# Patient Record
Sex: Male | Born: 1960 | Race: White | Hispanic: No | Marital: Married | Smoking: Never smoker
Health system: Southern US, Community
[De-identification: ages and names within clinical notes are randomized; demographics above are authoritative.]

## PROBLEM LIST (undated history)

## (undated) DIAGNOSIS — K219 Gastro-esophageal reflux disease without esophagitis: Secondary | ICD-10-CM

## (undated) DIAGNOSIS — J309 Allergic rhinitis, unspecified: Secondary | ICD-10-CM

## (undated) HISTORY — PX: WISDOM TOOTH EXTRACTION: SHX21

## (undated) HISTORY — DX: Gastro-esophageal reflux disease without esophagitis: K21.9

## (undated) HISTORY — PX: VASECTOMY: SHX75

## (undated) HISTORY — DX: Allergic rhinitis, unspecified: J30.9

---

## 2016-11-01 ENCOUNTER — Encounter: Payer: Self-pay | Admitting: *Deleted

## 2016-11-02 ENCOUNTER — Ambulatory Visit (INDEPENDENT_AMBULATORY_CARE_PROVIDER_SITE_OTHER): Payer: 59 | Admitting: Internal Medicine

## 2016-11-02 ENCOUNTER — Other Ambulatory Visit (INDEPENDENT_AMBULATORY_CARE_PROVIDER_SITE_OTHER): Payer: 59

## 2016-11-02 ENCOUNTER — Encounter: Payer: Self-pay | Admitting: Internal Medicine

## 2016-11-02 ENCOUNTER — Ambulatory Visit (INDEPENDENT_AMBULATORY_CARE_PROVIDER_SITE_OTHER)
Admission: RE | Admit: 2016-11-02 | Discharge: 2016-11-02 | Disposition: A | Discharge status: Home / Self Care | Payer: 59 | Source: Ambulatory Visit | Attending: Internal Medicine | Admitting: Internal Medicine

## 2016-11-02 VITALS — BP 124/84 | HR 87 | Ht 70.0 in | Wt 226.0 lb

## 2016-11-02 DIAGNOSIS — J45991 Cough variant asthma: Secondary | ICD-10-CM | POA: Diagnosis not present

## 2016-11-02 LAB — CBC WITH DIFFERENTIAL/PLATELET
BASOS ABS: 0 10*3/uL (ref 0.0–0.1)
BASOS PCT: 0.6 % (ref 0.0–3.0)
EOS ABS: 0 10*3/uL (ref 0.0–0.7)
Eosinophils Relative: 0.7 % (ref 0.0–5.0)
HEMATOCRIT: 47.8 % (ref 39.0–52.0)
Hemoglobin: 16.3 g/dL (ref 13.0–17.0)
LYMPHS ABS: 1.9 10*3/uL (ref 0.7–4.0)
LYMPHS PCT: 29.5 % (ref 12.0–46.0)
MCHC: 34 g/dL (ref 30.0–36.0)
MCV: 84.7 fl (ref 78.0–100.0)
MONO ABS: 0.5 10*3/uL (ref 0.1–1.0)
Monocytes Relative: 8.1 % (ref 3.0–12.0)
NEUTROS PCT: 61.1 % (ref 43.0–77.0)
Neutro Abs: 4 10*3/uL (ref 1.4–7.7)
PLATELETS: 226 10*3/uL (ref 150.0–400.0)
RBC: 5.65 Mil/uL (ref 4.22–5.81)
RDW: 13.7 % (ref 11.5–15.5)
WBC: 6.6 10*3/uL (ref 4.0–10.5)

## 2016-11-02 LAB — NITRIC OXIDE: Nitric Oxide: 14

## 2016-11-02 MED ORDER — FAMOTIDINE 20 MG PO TABS: ORAL_TABLET | ORAL | 2 refills | Status: AC

## 2016-11-02 NOTE — Patient Instructions (Addendum)
Change prilosec(omeprazole) to where you take it Take 30-60 min before first meal of the day and add pepcid 20 mg at bedtime (over the counter) x  3 month trial to see if cough better  If cough worsens stop clariton and For drainage / throat tickle try take CHLORPHENIRAMINE  4 mg - take one every 4 hours as needed - available over the counter- may cause drowsiness so start with just a bedtime dose or two and see how you tolerate it before trying in daytime    GERD (REFLUX)  is an extremely common cause of respiratory symptoms just like yours , many times with no obvious heartburn at all.    It can be treated with medication, but also with lifestyle changes including elevation of the head of your bed (ideally with 6 inch  bed blocks),  Smoking cessation, avoidance of late meals, excessive alcohol, and avoid fatty foods, chocolate, peppermint, colas, red wine, and acidic juices such as orange juice.  NO MINT OR MENTHOL PRODUCTS SO NO COUGH DROPS   USE SUGARLESS CANDY INSTEAD (Jolley ranchers or Stover's or Life Savers) or even ice chips will also do - the key is to swallow to prevent all throat clearing. NO OIL BASED VITAMINS - use powdered substitutes.  Please remember to go to the lab and x-ray department downstairs in the basement  for your tests - we will call you with the results when they are available.   If not satisfied return Please schedule a follow up visit in 3 months but call sooner if needed  with all medications /inhalers/ solutions in hand so we can verify exactly what you are taking. This includes all medications from all doctors and over the counters

## 2016-11-02 NOTE — Progress Notes (Signed)
**Note De-Identified Stuart Obfuscation** Subjective:     Patient ID: John Stuart, male   DOB: 08/20/1961,     MRN: [id number]  HPI  [id number] yowm never smoker coughed "all his life" referred to pulmonary clinic 11/02/2016 by Dr   Stuart    11/02/2016 [facility] office visit/ John Stuart   Chief Complaint  Patient presents with  . Pulmonary Consult    Referred by Dr. Caryn BeeKevin Stuart. Pt states that he has had a cough all of his life. He states that the cough does not bother him, but it bothers his spouse.   dry cough comes and goes, can be absent for months can happen a few days in a row  Then resolve completely  / worse in winter  Assoc with sense of pnds better on flonase / clariton x 3 weeks  Started omeprazole 3 weeks prior to OV     Kouffman Reflux v Neurogenic Cough Differentiator Reflux Comments  Do you awaken from a sound sleep coughing violently?                            With trouble breathing? No  no   Do you have choking episodes when you cannot  Get enough air, gasping for air ?              No    Do you usually cough when you lie down into  The bed, or when you just lie down to rest ?                          No    Do you usually cough after meals or eating?         no   Do you cough when (or after) you bend over?    no   GERD SCORE     Kouffman Reflux v Neurogenic Cough Differentiator Neurogenic   Do you more-or-less cough all day long? sporadic   Does change of temperature make you cough? yes   Does laughing or chuckling cause you to cough? No    Do fumes (perfume, automobile fumes, burned  Toast, etc.,) cause you to cough ?      No    Does speaking, singing, or talking on the phone cause you to cough   ?               No    Neurogenic/Airway score      Not limited by breathing from desired activities    No obvious day to day or daytime variability or assoc excess/ purulent sputum or mucus plugs or hemoptysis or cp or chest tightness, subjective wheeze or overt sinus or hb symptoms. No unusual exp hx or h/o  childhood pna/ asthma or knowledge of premature birth.  Sleeping ok without nocturnal  or early am exacerbation  of respiratory  c/o's or need for noct saba. Also denies any obvious fluctuation of symptoms with weather or environmental changes or other aggravating or alleviating factors except as outlined above   Current Medications, Allergies, Complete Past Medical History, Past Surgical History, Family History, and Social History were reviewed in [facility] electronic medical record.  ROS  The following are not active complaints unless bolded sore throat, dysphagia, dental problems, itching, sneezing,  nasal congestion or excess/ purulent secretions, ear ache,   fever, chills, sweats, unintended wt loss, classically pleuritic or exertional cp,  orthopnea pnd or leg  swelling, presyncope, palpitations, abdominal pain, anorexia, nausea, vomiting, diarrhea  or change in bowel or bladder habits, change in stools or urine, dysuria,hematuria,  rash, arthralgias, visual complaints, headache, numbness, weakness or ataxia or problems with walking or coordination,  change in mood/affect or memory.              Review of Systems     Objective:   Physical Exam amb wm nad   Wt Readings from Last 3 Encounters:  11/02/16 226 lb (102.5 kg)    Vital signs reviewed  - Note on arrival 02 sats  97% on RA    HEENT: nl dentition,   oropharynx. Nl external ear canals without cough reflex - moderate/ severe  bilateral non-specific turbinate edema     NECK :  without JVD/Nodes/TM/ nl carotid upstrokes bilaterally   LUNGS: no acc muscle use,  Nl contour chest which is clear to A and P bilaterally without cough on insp or exp maneuvers   CV:  RRR  no s3 or murmur or increase in P2, and no edema   ABD:  soft and nontender with nl inspiratory excursion in the supine position. No bruits or organomegaly appreciated, bowel sounds nl  MS:  Nl gait/ ext warm without deformities, calf tenderness,  cyanosis or clubbing No obvious joint restrictions   SKIN: warm and dry without lesions    NEURO:  alert, approp, nl sensorium with  no motor or cerebellar deficits apparent.      CXR PA and Lateral:   11/02/2016 :    I personally reviewed images and agree with radiology impression as follows:    No active cardiopulmonary disease.   Labs ordered  Allergy profile 11/02/2016 >  Eos 0.0/  IgE  Pending     Assessment:

## 2016-11-03 NOTE — Assessment & Plan Note (Signed)
Spirometry 11/02/2016  Nl - FENO 11/02/2016  =   14  - Allergy profile 11/02/2016 >  Eos 0.0 /  IgE    The standardized cough guidelines published in Chest by [place] in 2006 are still the best available and consist of a multiple step process (up to 12!) , not a single office visit,  and are intended  to address this problem logically,  with an alogrithm dependent on response to empiric treatment at  each progressive step  to determine a specific diagnosis with  minimal addtional testing needed. Therefore if adherence is an issue or can't be accurately verified,  it's very unlikely the standard evaluation and treatment will be successful here.    Furthermore, response to therapy (other than acute cough suppression, which should only be used short term with avoidance of narcotic containing cough syrups if possible), can be a gradual process for which the patient is not likely to  perceive immediate benefit.  Unlike going to an eye doctor where the best perscription is almost always the first one and is immediately effective, this is almost never the case in the management of chronic cough syndromes. Therefore the patient needs to commit up front to consistently adhere to recommendations  for up to 6 weeks of therapy directed at the likely underlying problem(s) before the response can be reasonably evaluated.   Pattern is not at all suggestive of asthma but rather Upper airway cough syndrome (previously labeled PNDS) , is  so named because it's frequently impossible to sort out how much is  CR/sinusitis with freq throat clearing (which can be related to primary GERD)   vs  causing  secondary (" extra esophageal")  GERD from wide swings in gastric pressure that occur with throat clearing, often  promoting self use of mint and menthol lozenges that reduce the lower esophageal sphincter tone and exacerbate the problem further in a cyclical fashion.   These are the same pts (now being labeled as having  "irritable larynx syndrome" by some cough centers) who not infrequently have a history of having failed to tolerate ace inhibitors,  dry powder inhalers or biphosphonates or report having atypical/extraesophageal reflux symptoms that don't respond to standard doses of PPI  and are easily confused as having aecopd or asthma flares by even experienced allergists/ pulmonologists (myself included).  Of the three most common causes of chronic cough, only one (GERD)  can actually trigger the other two (asthma and post nasal drip syndrome)  and perpetuate the cylce of cough inducing airway trauma, inflammation, heightened sensitivity to reflux which is prompted by the cough itself via a cyclical mechanism.    This may partially respond to steroids and look like asthma and post nasal drainage but never erradicated completely unless the cough and the secondary reflux are eliminated, preferably both at the same time.  While not intuitively obvious, many patients with chronic low grade reflux do not cough until there is a secondary insult that disturbs the protective epithelial barrier and exposes sensitive nerve endings.  This can be viral or direct physical injury such as with an endotracheal tube.   The point is that once this occurs, it is difficult to eliminate using anything but a maximally effective acid suppression regimen at least in the short run, accompanied by an appropriate diet to address non acid GERD.   rec max rx for gerd x 3 months then consider MCT if continues to cough on rx vs trial of gabaentin for irritable  larynx    Total time devoted to counseling  > 50 % of initial 60 min office visit:  review case with pt/ discussion of options/alternatives/ personally creating written customized instructions  in presence of pt  then going over those specific  Instructions directly with the pt including how to use all of the meds but in particular covering each new medication in detail and the difference  between the maintenance= "automatic" meds and the prns using an action plan format for the latter (If this problem/symptom => do that organization reading Left to right).  Please see AVS from this visit for a full list of these instructions which I personally wrote for this pt and  are unique to this visit.

## 2016-11-05 LAB — RESPIRATORY ALLERGY PROFILE REGION II ~~LOC~~
Allergen, A. alternata, m6: <0.1 kU/L
Allergen, C. Herbarum, M2: <0.1 kU/L
Allergen, Comm Silver Birch, t9: <0.1 kU/L
Allergen, Mulberry, t76: <0.1 kU/L
Allergen, P. notatum, m1: <0.1 kU/L
Aspergillus fumigatus, m3: <0.1 kU/L
Box Elder IgE: <0.1 kU/L
Cockroach: <0.1 kU/L
Common Ragweed: <0.1 kU/L
Elm IgE: <0.1 kU/L
IgE (Immunoglobulin E), Serum: 30 kU/L (ref <115)
Johnson Grass: <0.1 kU/L
Rough Pigweed  IgE: <0.1 kU/L
Sheep Sorrel IgE: <0.1 kU/L

## 2016-11-05 NOTE — Progress Notes (Signed)
LMTCB

## 2016-11-06 NOTE — Progress Notes (Signed)
LMTCB

## 2016-11-07 ENCOUNTER — Telehealth: Payer: Self-pay | Admitting: Internal Medicine

## 2016-11-07 NOTE — Telephone Encounter (Signed)
Notes Recorded by Nyoka CowdenMichael B Wert, MD on 11/05/2016 at 4:27 PM EST Call patient : Studies are unremarkable, no change in recs Notes Recorded by Nyoka CowdenMichael B Wert, MD on 11/03/2016 at 6:25 AM EST Call pt: Reviewed cxr and no acute change so no change in recommendations made at ov -------- lmtcb X1 to relay allergy profile and cxr results.

## 2016-11-08 NOTE — Telephone Encounter (Signed)
Patient is returning phone call.  °

## 2016-11-08 NOTE — Telephone Encounter (Signed)
lmtcb x1 for pt. 

## 2016-11-09 NOTE — Telephone Encounter (Signed)
lmomtcb x 2  

## 2016-11-12 NOTE — Telephone Encounter (Signed)
lmtcb x3 for pt. 

## 2016-11-13 NOTE — Telephone Encounter (Signed)
lmtcb x4 for pt. Per triage protocol, message will be closed. 

## 2017-02-08 ENCOUNTER — Ambulatory Visit: Payer: 59 | Admitting: Internal Medicine

## 2017-05-17 ENCOUNTER — Other Ambulatory Visit: Payer: Self-pay | Admitting: Family Medicine

## 2017-05-17 ENCOUNTER — Inpatient Hospital Stay (HOSPITAL_COMMUNITY)
Admission: EM | Admit: 2017-05-17 | Discharge: 2017-05-21 | DRG: 438 | Disposition: A | Discharge status: Home / Self Care | Payer: 59 | Attending: Internal Medicine | Admitting: Internal Medicine

## 2017-05-17 ENCOUNTER — Encounter (HOSPITAL_COMMUNITY): Payer: Self-pay

## 2017-05-17 ENCOUNTER — Emergency Department (HOSPITAL_COMMUNITY): Admit: 2017-05-17 | Discharge: 2017-05-17 | Disposition: A | Discharge status: Home / Self Care | Payer: 59

## 2017-05-17 ENCOUNTER — Observation Stay (HOSPITAL_COMMUNITY): Payer: 59

## 2017-05-17 ENCOUNTER — Ambulatory Visit
Admission: RE | Admit: 2017-05-17 | Discharge: 2017-05-17 | Disposition: A | Discharge status: Home / Self Care | Payer: 59 | Source: Ambulatory Visit | Attending: Family Medicine | Admitting: Family Medicine

## 2017-05-17 DIAGNOSIS — E785 Hyperlipidemia, unspecified: Secondary | ICD-10-CM | POA: Diagnosis present

## 2017-05-17 DIAGNOSIS — D72828 Other elevated white blood cell count: Secondary | ICD-10-CM

## 2017-05-17 DIAGNOSIS — K859 Acute pancreatitis without necrosis or infection, unspecified: Secondary | ICD-10-CM | POA: Diagnosis present

## 2017-05-17 DIAGNOSIS — R05 Cough: Secondary | ICD-10-CM

## 2017-05-17 DIAGNOSIS — I82409 Acute embolism and thrombosis of unspecified deep veins of unspecified lower extremity: Secondary | ICD-10-CM

## 2017-05-17 DIAGNOSIS — K8500 Idiopathic acute pancreatitis without necrosis or infection: Principal | ICD-10-CM | POA: Diagnosis present

## 2017-05-17 DIAGNOSIS — K219 Gastro-esophageal reflux disease without esophagitis: Secondary | ICD-10-CM | POA: Diagnosis present

## 2017-05-17 DIAGNOSIS — R059 Cough, unspecified: Secondary | ICD-10-CM

## 2017-05-17 DIAGNOSIS — K760 Fatty (change of) liver, not elsewhere classified: Secondary | ICD-10-CM | POA: Diagnosis present

## 2017-05-17 DIAGNOSIS — Z825 Family history of asthma and other chronic lower respiratory diseases: Secondary | ICD-10-CM

## 2017-05-17 DIAGNOSIS — R109 Unspecified abdominal pain: Secondary | ICD-10-CM

## 2017-05-17 DIAGNOSIS — J690 Pneumonitis due to inhalation of food and vomit: Secondary | ICD-10-CM | POA: Diagnosis not present

## 2017-05-17 DIAGNOSIS — J309 Allergic rhinitis, unspecified: Secondary | ICD-10-CM | POA: Diagnosis present

## 2017-05-17 DIAGNOSIS — Z79899 Other long term (current) drug therapy: Secondary | ICD-10-CM

## 2017-05-17 LAB — CBC
HEMATOCRIT: 36.1 % — AB (ref 39.0–52.0)
Hemoglobin: 12.3 g/dL — ABNORMAL LOW (ref 13.0–17.0)
MCH: 30.8 pg (ref 26.0–34.0)
MCHC: 34.1 g/dL (ref 30.0–36.0)
MCV: 90.5 fL (ref 78.0–100.0)
Platelets: 165 10*3/uL (ref 150–400)
RBC: 3.99 MIL/uL — ABNORMAL LOW (ref 4.22–5.81)
RDW: 12.9 % (ref 11.5–15.5)
WBC: 5.2 10*3/uL (ref 4.0–10.5)

## 2017-05-17 LAB — URINALYSIS, ROUTINE W REFLEX MICROSCOPIC
BACTERIA UA: NONE SEEN
Bilirubin Urine: NEGATIVE
Glucose, UA: NEGATIVE mg/dL
KETONES UR: 20 mg/dL — AB
Leukocytes, UA: NEGATIVE
Nitrite: NEGATIVE
PH: 5 (ref 5.0–8.0)
Protein, ur: NEGATIVE mg/dL

## 2017-05-17 LAB — COMPREHENSIVE METABOLIC PANEL
ALBUMIN: 4.2 g/dL (ref 3.5–5.0)
ALT: 27 U/L (ref 17–63)
AST: 28 U/L (ref 15–41)
Alkaline Phosphatase: 62 U/L (ref 38–126)
Anion gap: 10 (ref 5–15)
BUN: 20 mg/dL (ref 6–20)
CHLORIDE: 103 mmol/L (ref 101–111)
CO2: 22 mmol/L (ref 22–32)
Calcium: 8.9 mg/dL (ref 8.9–10.3)
Creatinine, Ser: 0.99 mg/dL (ref 0.61–1.24)
GFR calc Af Amer: >60 mL/min (ref >60)
GFR calc non Af Amer: >60 mL/min (ref >60)
GLUCOSE: 126 mg/dL — AB (ref 65–99)
POTASSIUM: 4 mmol/L (ref 3.5–5.1)
SODIUM: 135 mmol/L (ref 135–145)
Total Bilirubin: 1.1 mg/dL (ref 0.3–1.2)
Total Protein: 7.3 g/dL (ref 6.5–8.1)

## 2017-05-17 LAB — LIPASE, BLOOD: LIPASE: 256 U/L — AB (ref 11–51)

## 2017-05-17 MED ORDER — SODIUM CHLORIDE 0.9 % IV BOLUS (SEPSIS)
1000.0000 mL | Freq: Once | INTRAVENOUS | Status: AC
  Administered 2017-05-17: 1000 mL via INTRAVENOUS

## 2017-05-17 MED ORDER — IOPAMIDOL (ISOVUE-300) INJECTION 61%
125.0000 mL | Freq: Once | INTRAVENOUS | Status: AC | PRN
  Administered 2017-05-17: 125 mL via INTRAVENOUS

## 2017-05-17 MED ORDER — ENOXAPARIN SODIUM 40 MG/0.4ML ~~LOC~~ SOLN
40.0000 mg | Freq: Every day | SUBCUTANEOUS | Status: DC
  Administered 2017-05-19: 40 mg via SUBCUTANEOUS
  Filled 2017-05-17 (×2): qty 0.4

## 2017-05-17 MED ORDER — SODIUM CHLORIDE 0.9 % IV SOLN
INTRAVENOUS | Status: DC
  Administered 2017-05-17 – 2017-05-19 (×5): via INTRAVENOUS
  Administered 2017-05-19: 1000 mL via INTRAVENOUS
  Administered 2017-05-20: 01:00:00 via INTRAVENOUS

## 2017-05-17 MED ORDER — ACETAMINOPHEN 325 MG PO TABS
650.0000 mg | ORAL_TABLET | Freq: Four times a day (QID) | ORAL | Status: DC | PRN
  Administered 2017-05-18: 650 mg via ORAL
  Filled 2017-05-17: qty 2

## 2017-05-17 MED ORDER — MORPHINE SULFATE (PF) 2 MG/ML IV SOLN
2.0000 mg | INTRAVENOUS | Status: DC | PRN
  Administered 2017-05-17: 2 mg via INTRAVENOUS
  Administered 2017-05-18: 4 mg via INTRAVENOUS
  Administered 2017-05-18 (×2): 2 mg via INTRAVENOUS
  Filled 2017-05-17: qty 2
  Filled 2017-05-17 (×4): qty 1

## 2017-05-17 MED ORDER — ONDANSETRON HCL 4 MG/2ML IJ SOLN
4.0000 mg | Freq: Four times a day (QID) | INTRAMUSCULAR | Status: DC | PRN
  Administered 2017-05-18 – 2017-05-19 (×3): 4 mg via INTRAVENOUS
  Filled 2017-05-17 (×3): qty 2

## 2017-05-17 MED ORDER — ACETAMINOPHEN 650 MG RE SUPP: 650.0000 mg | Freq: Four times a day (QID) | RECTAL | Status: DC | PRN

## 2017-05-17 MED ORDER — ONDANSETRON HCL 4 MG PO TABS: 4.0000 mg | ORAL_TABLET | Freq: Four times a day (QID) | ORAL | Status: DC | PRN

## 2017-05-17 MED ORDER — MORPHINE SULFATE (PF) 4 MG/ML IV SOLN
4.0000 mg | Freq: Once | INTRAVENOUS | Status: AC
  Administered 2017-05-17: 4 mg via INTRAVENOUS
  Filled 2017-05-17: qty 1

## 2017-05-17 MED ORDER — ASPIRIN EC 81 MG PO TBEC
81.0000 mg | DELAYED_RELEASE_TABLET | Freq: Every day | ORAL | Status: DC
  Administered 2017-05-18 – 2017-05-21 (×4): 81 mg via ORAL
  Filled 2017-05-17 (×4): qty 1

## 2017-05-17 MED ORDER — ONDANSETRON HCL 4 MG/2ML IJ SOLN
4.0000 mg | Freq: Once | INTRAMUSCULAR | Status: AC
  Administered 2017-05-17: 4 mg via INTRAVENOUS
  Filled 2017-05-17: qty 2

## 2017-05-17 MED ORDER — PANTOPRAZOLE SODIUM 40 MG PO TBEC
40.0000 mg | DELAYED_RELEASE_TABLET | Freq: Every day | ORAL | Status: DC
  Administered 2017-05-18 – 2017-05-21 (×4): 40 mg via ORAL
  Filled 2017-05-17 (×4): qty 1

## 2017-05-17 MED ORDER — FLUTICASONE PROPIONATE 50 MCG/ACT NA SUSP
2.0000 | Freq: Every day | NASAL | Status: DC | PRN
  Filled 2017-05-17: qty 16

## 2017-05-17 MED ORDER — LORATADINE 10 MG PO TABS
10.0000 mg | ORAL_TABLET | Freq: Every day | ORAL | Status: DC
  Administered 2017-05-18 – 2017-05-21 (×4): 10 mg via ORAL
  Filled 2017-05-17 (×4): qty 1

## 2017-05-17 MED ORDER — ONDANSETRON HCL 4 MG/2ML IJ SOLN: 4.0000 mg | Freq: Four times a day (QID) | INTRAMUSCULAR | Status: DC | PRN

## 2017-05-17 MED ORDER — FAMOTIDINE 20 MG PO TABS
20.0000 mg | ORAL_TABLET | Freq: Every day | ORAL | Status: DC
Start: 2017-05-17 — End: 2017-05-21
  Administered 2017-05-18 – 2017-05-20 (×4): 20 mg via ORAL
  Filled 2017-05-17 (×4): qty 1

## 2017-05-17 NOTE — ED Notes (Signed)
Ultrasound in process at bedside at this time. 

## 2017-05-17 NOTE — Progress Notes (Signed)
*  PRELIMINARY RESULTS* Vascular Ultrasound Left lower extremity venous duplex has been completed.  Preliminary findings: No evidence of DVT or baker's cyst.   Farrel DemarkJill Eunice, RDMS, RVT  05/17/2017, 7:21 PM

## 2017-05-17 NOTE — ED Provider Notes (Signed)
[facility]-EMERGENCY DEPT Provider Note   CSN: [id number] Arrival date & time: 05/17/17  1632     History   Chief Complaint Chief Complaint  Patient presents with  . Abdominal Pain    HPI Semaj Coburn is a 56 y.o. male.  HPI   56 year old male with history of GERD presenting for evaluation of abdominal pain.patient states last night he felt fine this morning he felt fine however after around 9:00 in the morning when he was at work he developed mild abdominal discomfort.The discomfort became much more intense throughout the day, he felt nauseous without vomiting but he reported having crampy mid abdominal pain nothing seems to make it better or worse. He also have decrease in appetite. He went to his primary care office for evaluation. Blood work was performed and patient was told that he has an elevated white count. He had a abdominal and pelvis CT scan at an imaging center and subsequently was told that he has signs of pancreatitis as well as having a suspect DVT his left femoral region. He is recommended to come to the ER for admission. Earlier he did receive pain medication for his abdominal pain but now that the medication is one of he is report moderate abdominal pain. He denies any associated fever, chills, headache, lightheadedness, dizziness, chest pain, shortness of breath, dysuria, leg pain, focal numbness or weakness. He does report that his mom has leg DVT in the past. He has never had a history of blood clot in his legs or his lung. No recent travel, prolonged bed rest, unilateral leg swelling or calf pain. Denies any history of alcohol abuse and no history of diabetes. No recent travel, no new medication changes.  Past Medical History:  Diagnosis Date  . AR (allergic rhinitis)   . GERD (gastroesophageal reflux disease)     Patient Active Problem List   Diagnosis Date Noted  . 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  VASECTOMY    . WISDOM TOOTH EXTRACTION         Home Medications    Prior to Admission medications   Medication Sig Start Date End Date Taking? Authorizing Provider  ATORVASTATIN CALCIUM PO Take 1 tablet by mouth daily.    [provider]  famotidine (PEPCID) 20 MG tablet One at bedtime 11/02/16   Nyoka Cowden, MD  fluticasone [facility]) 50 MCG/ACT nasal spray Place 2 sprays into both nostrils daily as needed for allergies or rhinitis.    [provider]  loratadine (CLARITIN) 10 MG tablet Take 10 mg by mouth daily.    [provider]  omeprazole (PRILOSEC) 20 MG capsule Take 20 mg by mouth daily.    [provider]    Family History Family History  Problem Relation Age of Onset  . Clotting disorder Mother   . Asthma Mother   . Prostate cancer Father   . Asthma Daughter     Social History Social History  Substance Use Topics  . Smoking status: Never Smoker  . Smokeless tobacco: Never Used  . Alcohol use Yes     Comment: occasional     Allergies   Patient has no known allergies.   Review of Systems Review of Systems  All other systems reviewed and are negative.    Physical Exam Updated Vital Signs BP (!) 140/97 (BP Location: Left Arm)   Pulse 90   Temp 98.7 F (37.1 C) (Oral)  Resp 18   Wt 101.2 kg (223 lb)   SpO2 96%   BMI 32.00 kg/m   Physical Exam  Constitutional: He appears well-developed and well-nourished. No distress.  HENT:  Head: Atraumatic.  Eyes: Conjunctivae are normal.  Neck: Neck supple.  Cardiovascular: Normal rate and regular rhythm.   Pulmonary/Chest: Effort normal and breath sounds normal.  Abdominal: Soft. Bowel sounds are normal. He exhibits no distension. There is tenderness (tenderness to periumbilical region without guarding or rebound tenderness. Negative Murphy sign, no pain at McBurney's point).  Neurological: He is alert.  Skin: No rash noted.  Psychiatric: He has a normal mood and  affect.  Nursing note and vitals reviewed.    ED Treatments / Results  Labs (all labs ordered are listed, but only abnormal results are displayed) Labs Reviewed  LIPASE, BLOOD - Abnormal; Notable for the following:       Result Value   Lipase 256 (*)    All other components within normal limits  COMPREHENSIVE METABOLIC PANEL - Abnormal; Notable for the following:    Glucose, Bld 126 (*)    All other components within normal limits  CBC - Abnormal; Notable for the following:    RBC 3.99 (*)    Hemoglobin 12.3 (*)    HCT 36.1 (*)    All other components within normal limits  URINALYSIS, ROUTINE W REFLEX MICROSCOPIC - Abnormal; Notable for the following:    Specific Gravity, Urine >1.046 (*)    Hgb urine dipstick SMALL (*)    Ketones, ur 20 (*)    Squamous Epithelial / LPF 0-5 (*)    All other components within normal limits    EKG  EKG Interpretation None       Radiology Ct Abdomen Pelvis W Contrast  Result Date: 05/17/2017 CLINICAL DATA:  Acute abdominal pain, elevated white count EXAM: CT ABDOMEN AND PELVIS WITH CONTRAST TECHNIQUE: Multidetector CT imaging of the abdomen and pelvis was performed using the standard protocol following bolus administration of intravenous contrast. CONTRAST:  ISOVUE-300 IOPAMIDOL (ISOVUE-300) INJECTION 61% COMPARISON:  None. FINDINGS: Lower chest: Minor bibasilar atelectasis. No lower lobe pneumonia or acute process. No pericardial or pleural effusion. Normal heart size. Symmetric gynecomastia. Hepatobiliary: No focal liver abnormality is seen. No gallstones, gallbladder wall thickening, or biliary dilatation. Pancreas: Mild strandy edema and inflammation about the pancreas body and tail compatible with mild acute pancreatitis. No surrounding fluid collection or abscess. No other complicating feature. Spleen: Normal in size without focal abnormality. Adrenals/Urinary Tract: Adrenal glands are unremarkable. Kidneys are normal, without renal  calculi, focal lesion, or hydronephrosis. Bladder is unremarkable. Stomach/Bowel: Negative for bowel obstruction, significant dilatation, ileus, or free air. Normal appendix demonstrated. No acute colonic abnormality. No fluid collection or abscess. Vascular/Lymphatic: Negative for aneurysm or significant atherosclerosis. Mesenteric and renal vasculature all remain patent. No retroperitoneal abnormality or hemorrhage. No adenopathy. Hepatic and portal veins are patent. Splenic and mesenteric veins are patent. Nonocclusive hypodense filling defect in the left common femoral vein and superficial femoral vein suspicious for left femoral DVT. Reproductive: No acute finding by CT. Prostate normal size. Remote vasectomy clips noted. Other: Small fat containing inguinal hernias and small fat containing umbilical hernia. No fluid collection or ascites. Musculoskeletal: No acute osseous finding IMPRESSION: Mild acute pancreatitis of the pancreas body and tail in the left upper quadrant. No other complicating feature. Nonocclusive left common femoral and superficial femoral DVT is suspected by CT. Recommend follow-up ultrasound Electronically Signed   By: Judie Petit.  Shick M.D.   On: 05/17/2017 15:34    Procedures Procedures (including critical care time)  Medications Ordered in ED Medications  0.9 %  sodium chloride infusion (not administered)  morphine 2 MG/ML injection 2-4 mg (not administered)  ondansetron (ZOFRAN) injection 4 mg (not administered)  aspirin EC tablet 81 mg (not administered)  pantoprazole (PROTONIX) EC tablet 40 mg (not administered)  loratadine (CLARITIN) tablet 10 mg (not administered)  sodium chloride 0.9 % bolus 1,000 mL (1,000 mLs Intravenous New Bag/Given 05/17/17 1911)  morphine 4 MG/ML injection 4 mg (4 mg Intravenous Given 05/17/17 1911)  ondansetron (ZOFRAN) injection 4 mg (4 mg Intravenous Given 05/17/17 1911)     Initial Impression / Assessment and Plan / ED Course  I have reviewed the  triage vital signs and the nursing notes.  Pertinent labs & imaging results that were available during my care of the patient were reviewed by me and considered in my medical decision making (see chart for details).     BP (!) 140/97 (BP Location: Left Arm)   Pulse 90   Temp 98.7 F (37.1 C) (Oral)   Resp 18   Wt 101.2 kg (223 lb)   SpO2 96%   BMI 32.00 kg/m    Final Clinical Impressions(s) / ED Diagnoses   Final diagnoses:  Idiopathic acute pancreatitis without infection or necrosis    New Prescriptions New Prescriptions   No medications on file   6:19 PM Patient presents with midabdominal pain. An abdominal and pelvic CT scan performed earlier today demonstrated evidence of pancreatitis. He also has an elevated lipase in the 200s. Will give IVF, pain medication and antinausea medication.  Pt made NPO.  Furthermore, CT also noted a non occlusive L femoral DVT.  PT has no sxs in that leg.  Will obtain venous doppler US.    8:19 PM Negative doppler US.  Appreciate consultation from [facility] Dr. Julian ReilGardner who agrees to see pt in the ER and admit for pain control and IV hydration, observation status.     Fayrene Helperran, Tamerra Merkley, PA-C 05/17/17 2020    Tegeler, Canary Brimhristopher J, MD 05/18/17 [phone]

## 2017-05-17 NOTE — ED Triage Notes (Signed)
Patient c/o generalized abdominal pain. Patient states he went to a walk-in clinic. Patient states he had a CT scan today. Patient was told he had pancreatitis and a possible DVT in the femoral artery.

## 2017-05-17 NOTE — ED Notes (Signed)
Bed: WA12 Expected date:  Expected time:  Means of arrival:  Comments: No bed 

## 2017-05-17 NOTE — H&P (Signed)
History and Physical    John Stuart UJW:[id number] DOB: 1961/02/10 DOA: 05/17/2017  PCP: Iva Boop, MD  Patient coming from: Home  I have personally briefly reviewed patient's old medical records in [facility]  Chief Complaint: Abd pain  HPI: John Stuart is a 56 y.o. male with medical history significant of GERD, HLD on atorvastatin.  Patient presents to the ED with c/o abd pain.  Pain onset this AM around 9am, mild, worsened throughout day.  Nausea but no vomiting.  No diarrhea.  Nothing makes better or worse.  No recent EtOH use. No scorpion stings or recent travel.  ED Course: Mild acute pancreatitis on CT, lipase 250.  [place] leg is negative for DVT.   Review of Systems: As per HPI otherwise 10 point review of systems negative.   Past Medical History:  Diagnosis Date  . AR (allergic rhinitis)   . GERD (gastroesophageal reflux disease)     Past Surgical History:  Procedure Laterality Date  . VASECTOMY    . WISDOM TOOTH EXTRACTION       reports that he has never smoked. He has never used smokeless tobacco. He reports that he drinks alcohol. He reports that he does not use drugs.  No Known Allergies  Family History  Problem Relation Age of Onset  . Clotting disorder Mother   . Asthma Mother   . Prostate cancer Father   . Asthma Daughter      Prior to Admission medications   Medication Sig Start Date End Date Taking? Authorizing Provider  aspirin EC 81 MG tablet Take 81 mg by mouth daily.   Yes [provider]  ATORVASTATIN CALCIUM PO Take 1 tablet by mouth daily.   Yes [provider]  Coenzyme Q10 (CO Q 10 PO) Take 1 capsule by mouth at bedtime.   Yes [provider]  famotidine (PEPCID) 20 MG tablet One at bedtime 11/02/16  Yes Nyoka Cowden, MD  fluticasone [facility]) 50 MCG/ACT nasal spray Place 2 sprays into both nostrils daily as needed for allergies or rhinitis.   Yes [provider]  loratadine (CLARITIN) 10 MG  tablet Take 10 mg by mouth daily.   Yes [provider]  omeprazole (PRILOSEC) 20 MG capsule Take 20 mg by mouth daily.   Yes [provider]    Physical Exam: Vitals:   05/17/17 1646 05/17/17 2024  BP: (!) 140/97 123/83  Pulse: 90 87  Resp: 18 18  Temp: 98.7 F (37.1 C)   TempSrc: Oral   SpO2: 96% 92%  Weight: 101.2 kg (223 lb)     Constitutional: NAD, calm, comfortable Eyes: PERRL, lids and conjunctivae normal ENMT: Mucous membranes are moist. Posterior pharynx clear of any exudate or lesions.Normal dentition.  Neck: normal, supple, no masses, no thyromegaly Respiratory: clear to auscultation bilaterally, no wheezing, no crackles. Normal respiratory effort. No accessory muscle use.  Cardiovascular: Regular rate and rhythm, no murmurs / rubs / gallops. No extremity edema. 2+ pedal pulses. No carotid bruits.  Abdomen: Epigastric tenderness Musculoskeletal: no clubbing / cyanosis. No joint deformity upper and lower extremities. Good ROM, no contractures. Normal muscle tone.  Skin: no rashes, lesions, ulcers. No induration Neurologic: CN 2-12 grossly intact. Sensation intact, DTR normal. Strength 5/5 in all 4.  Psychiatric: Normal judgment and insight. Alert and oriented x 3. Normal mood.    Labs on Admission: I have personally reviewed following labs and imaging studies  CBC:  Recent Labs Lab 05/17/17 1723  WBC  5.2  HGB 12.3*  HCT 36.1*  MCV 90.5  PLT 165   Basic Metabolic Panel:  Recent Labs Lab 05/17/17 1723  NA 135  K 4.0  CL 103  CO2 22  GLUCOSE 126*  BUN 20  CREATININE 0.99  CALCIUM 8.9   GFR: CrCl cannot be calculated (Unknown ideal weight.). Liver Function Tests:  Recent Labs Lab 05/17/17 1723  AST 28  ALT 27  ALKPHOS 62  BILITOT 1.1  PROT 7.3  ALBUMIN 4.2    Recent Labs Lab 05/17/17 1723  LIPASE 256*   No results for input(s): AMMONIA in the last 168 hours. Coagulation Profile: No results for input(s): INR,  PROTIME in the last 168 hours. Cardiac Enzymes: No results for input(s): CKTOTAL, CKMB, CKMBINDEX, TROPONINI in the last 168 hours. BNP (last 3 results) No results for input(s): PROBNP in the last 8760 hours. HbA1C: No results for input(s): HGBA1C in the last 72 hours. CBG: No results for input(s): GLUCAP in the last 168 hours. Lipid Profile: No results for input(s): CHOL, HDL, LDLCALC, TRIG, CHOLHDL, LDLDIRECT in the last 72 hours. Thyroid Function Tests: No results for input(s): TSH, T4TOTAL, FREET4, T3FREE, THYROIDAB in the last 72 hours. Anemia Panel: No results for input(s): VITAMINB12, FOLATE, FERRITIN, TIBC, IRON, RETICCTPCT in the last 72 hours. Urine analysis:    Component Value Date/Time   COLORURINE YELLOW 05/17/2017 1659   APPEARANCEUR CLEAR 05/17/2017 1659   LABSPEC >1.046 (H) 05/17/2017 1659   PHURINE 5.0 05/17/2017 1659   GLUCOSEU NEGATIVE 05/17/2017 1659   HGBUR SMALL (A) 05/17/2017 1659   BILIRUBINUR NEGATIVE 05/17/2017 1659   KETONESUR 20 (A) 05/17/2017 1659   PROTEINUR NEGATIVE 05/17/2017 1659   NITRITE NEGATIVE 05/17/2017 1659   LEUKOCYTESUR NEGATIVE 05/17/2017 1659    Radiological Exams on Admission: Ct Abdomen Pelvis W Contrast  Result Date: 05/17/2017 CLINICAL DATA:  Acute abdominal pain, elevated white count EXAM: CT ABDOMEN AND PELVIS WITH CONTRAST TECHNIQUE: Multidetector CT imaging of the abdomen and pelvis was performed using the standard protocol following bolus administration of intravenous contrast. CONTRAST:  125mL ISOVUE-300 IOPAMIDOL (ISOVUE-300) INJECTION 61% COMPARISON:  None. FINDINGS: Lower chest: Minor bibasilar atelectasis. No lower lobe pneumonia or acute process. No pericardial or pleural effusion. Normal heart size. Symmetric gynecomastia. Hepatobiliary: No focal liver abnormality is seen. No gallstones, gallbladder wall thickening, or biliary dilatation. Pancreas: Mild strandy edema and inflammation about the pancreas body and tail  compatible with mild acute pancreatitis. No surrounding fluid collection or abscess. No other complicating feature. Spleen: Normal in size without focal abnormality. Adrenals/Urinary Tract: Adrenal glands are unremarkable. Kidneys are normal, without renal calculi, focal lesion, or hydronephrosis. Bladder is unremarkable. Stomach/Bowel: Negative for bowel obstruction, significant dilatation, ileus, or free air. Normal appendix demonstrated. No acute colonic abnormality. No fluid collection or abscess. Vascular/Lymphatic: Negative for aneurysm or significant atherosclerosis. Mesenteric and renal vasculature all remain patent. No retroperitoneal abnormality or hemorrhage. No adenopathy. Hepatic and portal veins are patent. Splenic and mesenteric veins are patent. Nonocclusive hypodense filling defect in the left common femoral vein and superficial femoral vein suspicious for left femoral DVT. Reproductive: No acute finding by CT. Prostate normal size. Remote vasectomy clips noted. Other: Small fat containing inguinal hernias and small fat containing umbilical hernia. No fluid collection or ascites. Musculoskeletal: No acute osseous finding IMPRESSION: Mild acute pancreatitis of the pancreas body and tail in the left upper quadrant. No other complicating feature. Nonocclusive left common femoral and superficial femoral DVT is suspected by CT. Recommend follow-up  ultrasound Electronically Signed   By: Judie Petit.  Shick M.D.   On: 05/17/2017 15:34    EKG: Independently reviewed.  Assessment/Plan Principal Problem:   Idiopathic acute pancreatitis without infection or necrosis    1. Acute pancreatitis - Mild and idiopathic at this point, DDx includes: gallstone, atorvastatin induced, autoimmune, triglyceride induced 1. RUQ [place] although CT didn't demonstrate findings suggestive of gallbladder disease nor did CMP 2. Repeat CMP in AM 3. IVF: 1L bolus in ED and NS at 150 cc/hr 4. Morphine prn 5. zofran prn 6. Lipid  profile  DVT prophylaxis: Lovenox Code Status: Full Family Communication: Wife at bedside Disposition Plan: Home after admit Consults called: None Admission status: [facility] in [facility], [place]. DO [facility] Pager [phone]  If 7AM-7PM, please contact day team taking care of patient [email or web address] Password [facility]  05/17/2017, 8:38 PM

## 2017-05-18 DIAGNOSIS — K219 Gastro-esophageal reflux disease without esophagitis: Secondary | ICD-10-CM | POA: Diagnosis present

## 2017-05-18 DIAGNOSIS — Z79899 Other long term (current) drug therapy: Secondary | ICD-10-CM | POA: Diagnosis not present

## 2017-05-18 DIAGNOSIS — E785 Hyperlipidemia, unspecified: Secondary | ICD-10-CM | POA: Diagnosis present

## 2017-05-18 DIAGNOSIS — J309 Allergic rhinitis, unspecified: Secondary | ICD-10-CM | POA: Diagnosis present

## 2017-05-18 DIAGNOSIS — K760 Fatty (change of) liver, not elsewhere classified: Secondary | ICD-10-CM | POA: Diagnosis present

## 2017-05-18 DIAGNOSIS — Z825 Family history of asthma and other chronic lower respiratory diseases: Secondary | ICD-10-CM | POA: Diagnosis not present

## 2017-05-18 DIAGNOSIS — J690 Pneumonitis due to inhalation of food and vomit: Secondary | ICD-10-CM | POA: Diagnosis not present

## 2017-05-18 DIAGNOSIS — K8500 Idiopathic acute pancreatitis without necrosis or infection: Secondary | ICD-10-CM | POA: Diagnosis present

## 2017-05-18 DIAGNOSIS — K859 Acute pancreatitis without necrosis or infection, unspecified: Secondary | ICD-10-CM | POA: Diagnosis present

## 2017-05-18 LAB — COMPREHENSIVE METABOLIC PANEL
ALT: 22 U/L (ref 17–63)
AST: 23 U/L (ref 15–41)
Albumin: 3.3 g/dL — ABNORMAL LOW (ref 3.5–5.0)
Alkaline Phosphatase: 46 U/L (ref 38–126)
Anion gap: 8 (ref 5–15)
BUN: 19 mg/dL (ref 6–20)
CHLORIDE: 104 mmol/L (ref 101–111)
CO2: 22 mmol/L (ref 22–32)
CREATININE: 0.97 mg/dL (ref 0.61–1.24)
Calcium: 8 mg/dL — ABNORMAL LOW (ref 8.9–10.3)
GFR calc Af Amer: >60 mL/min (ref >60)
GFR calc non Af Amer: >60 mL/min (ref >60)
Glucose, Bld: 126 mg/dL — ABNORMAL HIGH (ref 65–99)
Potassium: 3.6 mmol/L (ref 3.5–5.1)
SODIUM: 134 mmol/L — AB (ref 135–145)
Total Bilirubin: 1.2 mg/dL (ref 0.3–1.2)
Total Protein: 5.9 g/dL — ABNORMAL LOW (ref 6.5–8.1)

## 2017-05-18 LAB — CBC
HCT: 42.8 % (ref 39.0–52.0)
HEMOGLOBIN: 14.8 g/dL (ref 13.0–17.0)
MCH: 29.1 pg (ref 26.0–34.0)
MCHC: 34.6 g/dL (ref 30.0–36.0)
MCV: 84.1 fL (ref 78.0–100.0)
PLATELETS: 194 10*3/uL (ref 150–400)
RBC: 5.09 MIL/uL (ref 4.22–5.81)
RDW: 13.2 % (ref 11.5–15.5)
WBC: 21.3 10*3/uL — ABNORMAL HIGH (ref 4.0–10.5)

## 2017-05-18 LAB — LIPID PANEL
Cholesterol: 138 mg/dL (ref 0–200)
HDL: 34 mg/dL — ABNORMAL LOW (ref >40)
LDL CALC: 97 mg/dL (ref 0–99)
TRIGLYCERIDES: 37 mg/dL (ref <150)
Total CHOL/HDL Ratio: 4.1 RATIO
VLDL: 7 mg/dL (ref 0–40)

## 2017-05-18 LAB — LIPASE, BLOOD: Lipase: 201 U/L — ABNORMAL HIGH (ref 11–51)

## 2017-05-18 LAB — HIV ANTIBODY (ROUTINE TESTING W REFLEX): HIV SCREEN 4TH GENERATION: NONREACTIVE

## 2017-05-18 MED ORDER — ACETAMINOPHEN 650 MG RE SUPP: 650.0000 mg | Freq: Four times a day (QID) | RECTAL | Status: DC | PRN

## 2017-05-18 MED ORDER — MORPHINE SULFATE (PF) 2 MG/ML IV SOLN
2.0000 mg | INTRAVENOUS | Status: DC | PRN
  Administered 2017-05-18 (×2): 4 mg via INTRAVENOUS
  Administered 2017-05-18: 2 mg via INTRAVENOUS
  Filled 2017-05-18 (×3): qty 2

## 2017-05-18 MED ORDER — ACETAMINOPHEN 325 MG PO TABS
650.0000 mg | ORAL_TABLET | Freq: Four times a day (QID) | ORAL | Status: DC | PRN
  Administered 2017-05-18 – 2017-05-19 (×4): 650 mg via ORAL
  Filled 2017-05-18 (×4): qty 2

## 2017-05-18 NOTE — Progress Notes (Signed)
[facility] PROGRESS NOTE  John Stuart JXB:[id number] DOB: 1960-12-16 DOA: 05/17/2017  PCP: Iva Boop, MD  Brief History/Interval Summary: 56 year old Caucasian male with a past medical history of GERD, hyperlipidemia, presented with complaints of abdominal pain. He was found to have acute pancreatitis and was hospitalized for further management.  Reason for Visit: Acute pancreatitis  Consultants: None  Procedures:  Left lower extremity venous Doppler negative for DVT  Antibiotics: None  Subjective/Interval History: Patient states that he feels better compared to yesterday. Continues to have 4-5 out of 10 pain in the upper abdomen with some radiation to the back. No nausea, vomiting. No bowel movements. Not passing gas yet.  ROS: Denies any chest pain or shortness of breath.  Objective:  Vital Signs  Vitals:   05/17/17 2100 05/17/17 2200 05/18/17 0434 05/18/17 0520  BP:  135/86 121/68   Pulse:  96 98   Resp:  18 18   Temp:  98.9 F (37.2 C) (!) 100.8 F (38.2 C) 99.9 F (37.7 C)  TempSrc:  Oral Oral Oral  SpO2:  93% 92%   Weight:  101.2 kg (223 lb)    Height: 5' 8.11" (1.73 m)  (1.778 m)      Intake/Output Summary (Last 24 hours) at 05/18/17 1106 Last data filed at 05/18/17 0300  Gross per 24 hour  Intake             1735 ml  Output                0 ml  Net             1735 ml   Filed Weights   05/17/17 1646 05/17/17 2200  Weight: 101.2 kg (223 lb) 101.2 kg (223 lb)    General appearance: alert, cooperative, appears stated age and no distress Head: Normocephalic, without obvious abnormality, atraumatic Resp: clear to auscultation bilaterally Cardio: regular rate and rhythm, S1, S2 normal, no murmur, click, rub or gallop GI: Abdomen is soft. Tenderness appreciated in the epigastric area without any rebound, rigidity or guarding. No masses or organomegaly. Bowel sounds are present. Extremities: extremities normal, atraumatic, no cyanosis or  edema Neurologic: Awake, alert. Oriented 3. No focal neurological deficits.  Lab Results:  Data Reviewed: I have personally reviewed following labs and imaging studies  CBC:  Recent Labs Lab 05/17/17 1723 05/18/17 0425  WBC 5.2 21.3*  HGB 12.3* 14.8  HCT 36.1* 42.8  MCV 90.5 84.1  PLT 165 194    Basic Metabolic Panel:  Recent Labs Lab 05/17/17 1723 05/18/17 0425  NA 135 134*  K 4.0 3.6  CL 103 104  CO2 22 22  GLUCOSE 126* 126*  BUN 20 19  CREATININE 0.99 0.97  CALCIUM 8.9 8.0*    GFR: Estimated Creatinine Clearance: 100.2 mL/min (by C-G formula based on SCr of 0.97 mg/dL).  Liver Function Tests:  Recent Labs Lab 05/17/17 1723 05/18/17 0425  AST 28 23  ALT 27 22  ALKPHOS 62 46  BILITOT 1.1 1.2  PROT 7.3 5.9*  ALBUMIN 4.2 3.3*     Recent Labs Lab 05/17/17 1723 05/18/17 0425  LIPASE 256* 201*    Lipid Profile:  Recent Labs  05/17/17 2028  CHOL 138  HDL 34*  LDLCALC 97  TRIG 37  CHOLHDL 4.1    Radiology Studies: Ct Abdomen Pelvis W Contrast  Result Date: 05/17/2017 CLINICAL DATA:  Acute abdominal pain, elevated white count EXAM: CT ABDOMEN AND PELVIS WITH CONTRAST TECHNIQUE: Multidetector  CT imaging of the abdomen and pelvis was performed using the standard protocol following bolus administration of intravenous contrast. CONTRAST:  125mL ISOVUE-300 IOPAMIDOL (ISOVUE-300) INJECTION 61% COMPARISON:  None. FINDINGS: Lower chest: Minor bibasilar atelectasis. No lower lobe pneumonia or acute process. No pericardial or pleural effusion. Normal heart size. Symmetric gynecomastia. Hepatobiliary: No focal liver abnormality is seen. No gallstones, gallbladder wall thickening, or biliary dilatation. Pancreas: Mild strandy edema and inflammation about the pancreas body and tail compatible with mild acute pancreatitis. No surrounding fluid collection or abscess. No other complicating feature. Spleen: Normal in size without focal abnormality. Adrenals/Urinary  Tract: Adrenal glands are unremarkable. Kidneys are normal, without renal calculi, focal lesion, or hydronephrosis. Bladder is unremarkable. Stomach/Bowel: Negative for bowel obstruction, significant dilatation, ileus, or free air. Normal appendix demonstrated. No acute colonic abnormality. No fluid collection or abscess. Vascular/Lymphatic: Negative for aneurysm or significant atherosclerosis. Mesenteric and renal vasculature all remain patent. No retroperitoneal abnormality or hemorrhage. No adenopathy. Hepatic and portal veins are patent. Splenic and mesenteric veins are patent. Nonocclusive hypodense filling defect in the left common femoral vein and superficial femoral vein suspicious for left femoral DVT. Reproductive: No acute finding by CT. Prostate normal size. Remote vasectomy clips noted. Other: Small fat containing inguinal hernias and small fat containing umbilical hernia. No fluid collection or ascites. Musculoskeletal: No acute osseous finding IMPRESSION: Mild acute pancreatitis of the pancreas body and tail in the left upper quadrant. No other complicating feature. Nonocclusive left common femoral and superficial femoral DVT is suspected by CT. Recommend follow-up ultrasound Electronically Signed   By: Judie PetitM.  Shick M.D.   On: 05/17/2017 15:34   [place] Abdomen Limited Ruq  Result Date: 05/17/2017 CLINICAL DATA:  Patient with history of pancreatitis. EXAM: ULTRASOUND ABDOMEN LIMITED RIGHT UPPER QUADRANT COMPARISON:  CT abdomen pelvis 05/17/2017 FINDINGS: Gallbladder: No gallbladder wall thickening. Suggestion of 2 adjacent polyps measuring up to 4 mm. No pericholecystic fluid. Negative sonographic Murphy sign. Common bile duct: Diameter: 5 mm Liver: Increased in echogenicity. No focal lesion identified. Portal vein is patent on color Doppler imaging with normal direction of blood flow towards the liver. IMPRESSION: No cholelithiasis or sonographic evidence for acute cholecystitis. Hepatic steatosis.  Electronically Signed   By: Annia Beltrew  Davis M.D.   On: 05/17/2017 21:47     Medications:  Scheduled: . aspirin EC  81 mg Oral Daily  . enoxaparin (LOVENOX) injection  40 mg Subcutaneous QHS  . famotidine  20 mg Oral QHS  . loratadine  10 mg Oral Daily  . pantoprazole  40 mg Oral Daily   Continuous: . sodium chloride 125 mL/hr at 05/18/17 0947   ZOX:[id number]:acetaminophen **OR** acetaminophen, fluticasone, morphine injection, ondansetron **OR** ondansetron (ZOFRAN) IV  Assessment/Plan:  Principal Problem:   Idiopathic acute pancreatitis without infection or necrosis    Acute pancreatitis. Patient denies significant alcohol intake. No gallstones identified on ultrasound. Reason for his pancreatitis is not entirely clear. Triglyceride level is normal. He has been taking atorvastatin for a year. This is likely idiopathic. For now continue supportive care. Continue IV fluids. Leave him on clear liquid diet for now. Increase in WBC is noted. Low-grade fever is present. No findings of necrosis noted on CT scan. Hold off on antibiotics.  Left lower extremity DVT suspected on CT scan. Doppler studies negative for DVT. No further workup.  History of GERD. Continue PPI.   DVT Prophylaxis: Lovenox    Code Status: Full code  Family Communication: Discussed with the patient  Disposition Plan: Management  as outlined above    LOS: 0 days   [facility]  [facility] Pager [phone] 05/18/2017, 11:06 AM  If 7PM-7AM, please contact night-coverage at [email or web address], password [facility]

## 2017-05-18 NOTE — Progress Notes (Signed)
Dr, Rito EhrlichKrishnan notified of temp of 100.9. He requested to go ahead  And adm tylenol

## 2017-05-19 ENCOUNTER — Inpatient Hospital Stay (HOSPITAL_COMMUNITY): Payer: 59

## 2017-05-19 LAB — COMPREHENSIVE METABOLIC PANEL
ALBUMIN: 3 g/dL — AB (ref 3.5–5.0)
ALK PHOS: 40 U/L (ref 38–126)
ALT: 28 U/L (ref 17–63)
ANION GAP: 7 (ref 5–15)
AST: 31 U/L (ref 15–41)
BILIRUBIN TOTAL: 1.7 mg/dL — AB (ref 0.3–1.2)
BUN: 13 mg/dL (ref 6–20)
CALCIUM: 7.8 mg/dL — AB (ref 8.9–10.3)
CO2: 20 mmol/L — AB (ref 22–32)
Chloride: 104 mmol/L (ref 101–111)
Creatinine, Ser: 0.9 mg/dL (ref 0.61–1.24)
GFR calc Af Amer: >60 mL/min (ref >60)
GFR calc non Af Amer: >60 mL/min (ref >60)
GLUCOSE: 135 mg/dL — AB (ref 65–99)
Potassium: 3.8 mmol/L (ref 3.5–5.1)
SODIUM: 131 mmol/L — AB (ref 135–145)
TOTAL PROTEIN: 5.7 g/dL — AB (ref 6.5–8.1)

## 2017-05-19 LAB — CBC
HEMATOCRIT: 40.3 % (ref 39.0–52.0)
HEMOGLOBIN: 13.7 g/dL (ref 13.0–17.0)
MCH: 28.7 pg (ref 26.0–34.0)
MCHC: 34 g/dL (ref 30.0–36.0)
MCV: 84.3 fL (ref 78.0–100.0)
Platelets: 167 10*3/uL (ref 150–400)
RBC: 4.78 MIL/uL (ref 4.22–5.81)
RDW: 13.4 % (ref 11.5–15.5)
WBC: 27.3 10*3/uL — AB (ref 4.0–10.5)

## 2017-05-19 LAB — LIPASE, BLOOD: Lipase: 56 U/L — ABNORMAL HIGH (ref 11–51)

## 2017-05-19 MED ORDER — DEXTROSE 5 % IV SOLN
1.0000 g | Freq: Every day | INTRAVENOUS | Status: DC
  Administered 2017-05-19 – 2017-05-20 (×2): 1 g via INTRAVENOUS
  Filled 2017-05-19 (×2): qty 10

## 2017-05-19 MED ORDER — BENZONATATE 100 MG PO CAPS
200.0000 mg | ORAL_CAPSULE | Freq: Two times a day (BID) | ORAL | Status: DC | PRN
Start: 2017-05-19 — End: 2017-05-21

## 2017-05-19 MED ORDER — DEXTROSE 5 % IV SOLN
500.0000 mg | INTRAVENOUS | Status: DC
  Administered 2017-05-20 – 2017-05-21 (×2): 500 mg via INTRAVENOUS
  Filled 2017-05-19 (×2): qty 500

## 2017-05-19 NOTE — Progress Notes (Signed)
Pt's family at bedside.Pt has been ambulating in the halls x3, tolerating well. One episode of nausea and feeling achy all over. Medicated with Tylenol, pt states feeling better. Appetite fair.

## 2017-05-19 NOTE — Progress Notes (Signed)
[facility] PROGRESS NOTE  John Stuart ZOX:[id number] DOB: 12/30/1960 DOA: 05/17/2017  PCP: Iva Boop, MD  Brief History/Interval Summary: 56 year old Caucasian male with a past medical history of GERD, hyperlipidemia, presented with complaints of abdominal pain. He was found to have acute pancreatitis and was hospitalized for further management.  Reason for Visit: Acute pancreatitis  Consultants: None  Procedures:  Left lower extremity venous Doppler negative for DVT  Antibiotics: None  Subjective/Interval History: Patient continues to feel well. Abdominal pain has significantly improved. He denies any nausea, vomiting. Has had a bowel movement and is passing gas from below.   ROS: Denies any chest pain or shortness of breath  Objective:  Vital Signs  Vitals:   05/18/17 0520 05/18/17 1415 05/18/17 2143 05/19/17 0450  BP:  109/74 119/60 124/72  Pulse:  100 96 89  Resp:  Temp: 99.9 F (37.7 C) (!) 100.9 F (38.3 C) 99.1 F (37.3 C) 99 F (37.2 C)  TempSrc: Oral Oral Oral Oral  SpO2:  90% 92% 93%  Weight:      Height:        Intake/Output Summary (Last 24 hours) at 05/19/17 0852 Last data filed at 05/18/17 2123  Gross per 24 hour  Intake              250 ml  Output                0 ml  Net              250 ml   Filed Weights   05/17/17 1646 05/17/17 2200  Weight: 101.2 kg (223 lb) 101.2 kg (223 lb)    General appearance: Awake, alert. In no distress Resp: Clear to auscultation bilaterally. Cardio: S1, S2 is normal, regular. No rubs, murmurs or bruits GI: Abdomen is soft. Mildly tender in the epigastric area without any rebound, rigidity or guarding. No masses or organomegaly. Bowel sounds are present. Extremities: No edema Neurologic: Awake, alert. Oriented 3. No focal neurological deficits.  Lab Results:  Data Reviewed: I have personally reviewed following labs and imaging studies  CBC:  Recent Labs Lab 05/17/17 1723  05/18/17 0425 05/19/17 0416  WBC 5.2 21.3* 27.3*  HGB 12.3* 14.8 13.7  HCT 36.1* 42.8 40.3  MCV 90.5 84.1 84.3  PLT 165 194 167    Basic Metabolic Panel:  Recent Labs Lab 05/17/17 1723 05/18/17 0425 05/19/17 0416  NA 135 134* 131*  K 4.0 3.6 3.8  CL 103 104 104  CO2 22 22 20*  GLUCOSE 126* 126* 135*  BUN CREATININE 0.99 0.97 0.90  CALCIUM 8.9 8.0* 7.8*    GFR: Estimated Creatinine Clearance: 108 mL/min (by C-G formula based on SCr of 0.9 mg/dL).  Liver Function Tests:  Recent Labs Lab 05/17/17 1723 05/18/17 0425 05/19/17 0416  AST ALT ALKPHOS 62 46 40  BILITOT 1.1 1.2 1.7*  PROT 7.3 5.9* 5.7*  ALBUMIN 4.2 3.3* 3.0*     Recent Labs Lab 05/17/17 1723 05/18/17 0425 05/19/17 0416  LIPASE 256* 201* 56*    Lipid Profile:  Recent Labs  05/17/17 2028  CHOL 138  HDL 34*  LDLCALC 97  TRIG 37  CHOLHDL 4.1    Radiology Studies: Ct Abdomen Pelvis W Contrast  Result Date: 05/17/2017 CLINICAL DATA:  Acute abdominal pain, elevated white count EXAM: CT ABDOMEN AND PELVIS WITH CONTRAST TECHNIQUE: Multidetector CT imaging of  the abdomen and pelvis was performed using the standard protocol following bolus administration of intravenous contrast. CONTRAST:  ISOVUE-300 IOPAMIDOL (ISOVUE-300) INJECTION 61% COMPARISON:  None. FINDINGS: Lower chest: Minor bibasilar atelectasis. No lower lobe pneumonia or acute process. No pericardial or pleural effusion. Normal heart size. Symmetric gynecomastia. Hepatobiliary: No focal liver abnormality is seen. No gallstones, gallbladder wall thickening, or biliary dilatation. Pancreas: Mild strandy edema and inflammation about the pancreas body and tail compatible with mild acute pancreatitis. No surrounding fluid collection or abscess. No other complicating feature. Spleen: Normal in size without focal abnormality. Adrenals/Urinary Tract: Adrenal glands are unremarkable. Kidneys are normal, without  renal calculi, focal lesion, or hydronephrosis. Bladder is unremarkable. Stomach/Bowel: Negative for bowel obstruction, significant dilatation, ileus, or free air. Normal appendix demonstrated. No acute colonic abnormality. No fluid collection or abscess. Vascular/Lymphatic: Negative for aneurysm or significant atherosclerosis. Mesenteric and renal vasculature all remain patent. No retroperitoneal abnormality or hemorrhage. No adenopathy. Hepatic and portal veins are patent. Splenic and mesenteric veins are patent. Nonocclusive hypodense filling defect in the left common femoral vein and superficial femoral vein suspicious for left femoral DVT. Reproductive: No acute finding by CT. Prostate normal size. Remote vasectomy clips noted. Other: Small fat containing inguinal hernias and small fat containing umbilical hernia. No fluid collection or ascites. Musculoskeletal: No acute osseous finding IMPRESSION: Mild acute pancreatitis of the pancreas body and tail in the left upper quadrant. No other complicating feature. Nonocclusive left common femoral and superficial femoral DVT is suspected by CT. Recommend follow-up ultrasound Electronically Signed   By: Judie Petit.  Shick M.D.   On: 05/17/2017 15:34   US Abdomen Limited Ruq  Result Date: 05/17/2017 CLINICAL DATA:  Patient with history of pancreatitis. EXAM: ULTRASOUND ABDOMEN LIMITED RIGHT UPPER QUADRANT COMPARISON:  CT abdomen pelvis 05/17/2017 FINDINGS: Gallbladder: No gallbladder wall thickening. Suggestion of 2 adjacent polyps measuring up to 4 mm. No pericholecystic fluid. Negative sonographic Murphy sign. Common bile duct: Diameter: 5 mm Liver: Increased in echogenicity. No focal lesion identified. Portal vein is patent on color Doppler imaging with normal direction of blood flow towards the liver. IMPRESSION: No cholelithiasis or sonographic evidence for acute cholecystitis. Hepatic steatosis. Electronically Signed   By: Annia Belt M.D.   On: 05/17/2017 21:47      Medications:  Scheduled: . aspirin EC  81 mg Oral Daily  . enoxaparin (LOVENOX) injection  40 mg Subcutaneous QHS  . famotidine  20 mg Oral QHS  . loratadine  10 mg Oral Daily  . pantoprazole  40 mg Oral Daily   Continuous: . sodium chloride 125 mL/hr at 05/19/17 0300   GNF:[id number] **OR** acetaminophen, fluticasone, morphine injection, ondansetron **OR** ondansetron (ZOFRAN) IV  Assessment/Plan:  Principal Problem:   Idiopathic acute pancreatitis without infection or necrosis Active Problems:   Acute pancreatitis    Acute pancreatitis Patient denies significant alcohol intake. No gallstones identified on ultrasound. Triglyceride level is normal. He has been taking atorvastatin for a year. Reason for his pancreatitis is not entirely clear. This is likely idiopathic. For now continue supportive care. Patient seems to be improving clinically. WBC noted to be climbing. Patient does have low-grade fever. These findings could be due to the inflammation. Continue to monitor for now. There were no findings of necrosis on CT scan. Continue to hold off on antibiotics. Mobilize.  Left lower extremity DVT suspected on CT scan. Doppler studies negative for DVT. No further workup.  History of GERD. Continue PPI.  DVT Prophylaxis: Lovenox  Code Status: Full code  Family Communication: Discussed with the patient  Disposition Plan: Management as outlined above    LOS: 1 day   [facility]RISHNA[facility],Fatemah Pourciau  [facility] Pager [phone] 05/19/2017, 8:52 AM  If 7PM-7AM, please contact night-coverage at [email or web address], password [email or web address]

## 2017-05-20 DIAGNOSIS — J690 Pneumonitis due to inhalation of food and vomit: Secondary | ICD-10-CM

## 2017-05-20 LAB — CBC
HEMATOCRIT: 36.7 % — AB (ref 39.0–52.0)
HEMOGLOBIN: 12.8 g/dL — AB (ref 13.0–17.0)
MCH: 28.4 pg (ref 26.0–34.0)
MCHC: 34.9 g/dL (ref 30.0–36.0)
MCV: 81.6 fL (ref 78.0–100.0)
Platelets: 137 10*3/uL — ABNORMAL LOW (ref 150–400)
RBC: 4.5 MIL/uL (ref 4.22–5.81)
RDW: 13.1 % (ref 11.5–15.5)
WBC: 15.9 10*3/uL — AB (ref 4.0–10.5)

## 2017-05-20 LAB — COMPREHENSIVE METABOLIC PANEL
ALBUMIN: 2.8 g/dL — AB (ref 3.5–5.0)
ALT: 98 U/L — AB (ref 17–63)
AST: 102 U/L — AB (ref 15–41)
Alkaline Phosphatase: 47 U/L (ref 38–126)
Anion gap: 6 (ref 5–15)
BILIRUBIN TOTAL: 0.9 mg/dL (ref 0.3–1.2)
BUN: 8 mg/dL (ref 6–20)
CO2: 26 mmol/L (ref 22–32)
CREATININE: 0.89 mg/dL (ref 0.61–1.24)
Calcium: 7.9 mg/dL — ABNORMAL LOW (ref 8.9–10.3)
Chloride: 104 mmol/L (ref 101–111)
GFR calc Af Amer: >60 mL/min (ref >60)
GFR calc non Af Amer: >60 mL/min (ref >60)
GLUCOSE: 153 mg/dL — AB (ref 65–99)
POTASSIUM: 3.4 mmol/L — AB (ref 3.5–5.1)
Sodium: 136 mmol/L (ref 135–145)
TOTAL PROTEIN: 5.6 g/dL — AB (ref 6.5–8.1)

## 2017-05-20 MED ORDER — POTASSIUM CHLORIDE CRYS ER 20 MEQ PO TBCR
40.0000 meq | EXTENDED_RELEASE_TABLET | Freq: Once | ORAL | Status: AC
  Administered 2017-05-20: 40 meq via ORAL
  Filled 2017-05-20: qty 2

## 2017-05-20 NOTE — Progress Notes (Signed)
[facility] PROGRESS NOTE  John Stuart GNF:[id number] DOB: 07/07/1961 DOA: 05/17/2017  PCP: Iva BoopVia, Kevin, MD  Brief History/Interval Summary: 56 year old Caucasian male with a past medical history of GERD, hyperlipidemia, presented with complaints of abdominal pain. He was found to have acute pancreatitis and was hospitalized for further management. Patient developed a cough overnight on 9/9. Chest x-ray suggested pneumonia. He was placed on antibiotics.  Reason for Visit: Acute pancreatitis  Consultants: None  Procedures:  Left lower extremity venous Doppler negative for DVT  Antibiotics: None  Subjective/Interval History: Patient developed a cough last night. Denies any chest pain or shortness of breath. Denies any significant sputum production. He tells me that he did have an episode of vomiting prior to admission, but none since then. His abdominal pain has resolved. He is tolerating his liquid diet.   ROS: Denies any chest pain  Objective:  Vital Signs  Vitals:   05/19/17 1418 05/19/17 2132 05/20/17 0222 05/20/17 0535  BP: 125/67 130/85 130/84 (!) 141/86  Pulse: (!) 101 91 84 80  Resp: 16 16 16 20   Temp: 98.5 F (36.9 C) 98.3 F (36.8 C) 98.2 F (36.8 C) 98.2 F (36.8 C)  TempSrc: Oral Oral Oral Oral  SpO2: 91% 93% 92% 92%  Weight:      Height:        Intake/Output Summary (Last 24 hours) at 05/20/17 0825 Last data filed at 05/20/17 0532  Gross per 24 hour  Intake          4559.41 ml  Output                0 ml  Net          4559.41 ml   Filed Weights   05/17/17 1646 05/17/17 2200  Weight: 101.2 kg (223 lb) 101.2 kg (223 lb)    General appearance: Awake, alert. In no distress Resp: Few crackles heard in the right lung base. No wheezing. No rhonchi. Cardio: S1, S2 is normal, regular.2. No rubs, murmurs, or bruit GI: Abdomen is soft. Nontender today. No masses or organomegaly. Bowel sounds are present and normal. Extremities: No edema Neurologic:  Awake, alert. Oriented 3. No focal neurological deficits  Lab Results:  Data Reviewed: I have personally reviewed following labs and imaging studies  CBC:  Recent Labs Lab 05/17/17 1723 05/18/17 0425 05/19/17 0416 05/20/17 0358  WBC 5.2 21.3* 27.3* 15.9*  HGB 12.3* 14.8 13.7 12.8*  HCT 36.1* 42.8 40.3 36.7*  MCV 90.5 84.1 84.3 81.6  PLT 165 194 167 137*    Basic Metabolic Panel:  Recent Labs Lab 05/17/17 1723 05/18/17 0425 05/19/17 0416 05/20/17 0358  NA 135 134* 131* 136  K 4.0 3.6 3.8 3.4*  CL 103 104 104 104  CO2 22 22 20* 26  GLUCOSE 126* 126* 135* 153*  BUN 20 19 13 8   CREATININE 0.99 0.97 0.90 0.89  CALCIUM 8.9 8.0* 7.8* 7.9*    GFR: Estimated Creatinine Clearance: 109.2 mL/min (by C-G formula based on SCr of 0.89 mg/dL).  Liver Function Tests:  Recent Labs Lab 05/17/17 1723 05/18/17 0425 05/19/17 0416 05/20/17 0358  AST 28 23 31  102*  ALT 27 22 28  98*  ALKPHOS 62 46 40 47  BILITOT 1.1 1.2 1.7* 0.9  PROT 7.3 5.9* 5.7* 5.6*  ALBUMIN 4.2 3.3* 3.0* 2.8*     Recent Labs Lab 05/17/17 1723 05/18/17 0425 05/19/17 0416  LIPASE 256* 201* 56*    Lipid Profile:  Recent Labs  05/17/17 2028  CHOL 138  HDL 34*  LDLCALC 97  TRIG 37  CHOLHDL 4.1    Radiology Studies: Dg Chest Port 1 View  Result Date: 05/19/2017 CLINICAL DATA:  Cough and weakness. EXAM: PORTABLE CHEST 1 VIEW COMPARISON:  11/02/2016 FINDINGS: Elevation of the right hemidiaphragm. Interval development of linear infiltration or atelectasis in the right lung base. Can't exclude a right basilar pneumonia. Left lung is clear. Heart size and pulmonary vascularity are normal. No blunting of costophrenic angles. No pneumothorax. IMPRESSION: Infiltration or atelectasis in the right lung base. Can't exclude pneumonia. Electronically Signed   By: Burman Nieves M.D.   On: 05/19/2017 22:08     Medications:  Scheduled: . aspirin EC  81 mg Oral Daily  . enoxaparin (LOVENOX) injection   40 mg Subcutaneous QHS  . famotidine  20 mg Oral QHS  . loratadine  10 mg Oral Daily  . pantoprazole  40 mg Oral Daily   Continuous: . azithromycin Stopped (05/20/17 0222)  . cefTRIAXone (ROCEPHIN)  IV Stopped (05/20/17 0017)   ZOX:[id number] **OR** acetaminophen, benzonatate, fluticasone, morphine injection, ondansetron **OR** ondansetron (ZOFRAN) IV  Assessment/Plan:  Principal Problem:   Idiopathic acute pancreatitis without infection or necrosis Active Problems:   Acute pancreatitis    Acute pancreatitis. Patient denies significant alcohol intake. No gallstones identified on ultrasound. Triglyceride level is normal. He has been taking atorvastatin for a year. Reason for his pancreatitis is not entirely clear. This is likely idiopathic. Patient has improved. He is tolerating his diet. WBC has improved as well. Advance diet to soft today. Increase in AST and ALT noted, but bilirubin is normal. The reason is not clear. Abdomen remains benign. We will repeat labs tomorrow morning. Continue to mobilize.   Pneumonia, likely aspiration. Patient developed a cough overnight and chest x-ray suggested atelectasis versus infiltrate in the right base. He does have crackles on examination. Continue with the IV antibiotics for today. Anticipate transitioning to oral tomorrow.  Left lower extremity DVT suspected on CT scan. Doppler studies negative for DVT. No further workup.  History of GERD. Continue PPI.  DVT Prophylaxis: Lovenox    Code Status: Full code  Family Communication: Discussed with the patient  Disposition Plan: Management as outlined above.    LOS: 2 days   [facility]  [facility] Pager [phone] 05/20/2017, 8:25 AM  If 7PM-7AM, please contact night-coverage at [email or web address], password [email or web address]

## 2017-05-20 NOTE — Care Management Note (Signed)
Case Management Note  Patient Details  Name: John Stuart MRN: [id number] Date of Birth: 10/01/1960  Subjective/Objective:    56 yo admitted with Idiopathic acute pancreatitis without infection or necrosis.                Action/Plan: From home.  Expected Discharge Date:                  Expected Discharge Plan:  Home/Self Care  In-House Referral:     Discharge planning Services  CM Consult  Post Acute Care Choice:    Choice offered to:     DME Arranged:    DME Agency:     HH Arranged:    HH Agency:     Status of Service:  In process, will continue to follow  If discussed at Long Length of Stay Meetings, dates discussed:    Additional CommentsBartholome Bill:  Ranon Coven H, RN 05/20/2017, 2:47 PM  [phone]

## 2017-05-20 NOTE — Progress Notes (Signed)
Pharmacy Antibiotic Note  John PullingRussell Stuart is a 56 y.o. male admitted on 05/17/2017 with pneumonia.  Pharmacy has been consulted for rocephin dosing.  Plan: Rocephin 1 gm IV q24h Zmax 500 mg IV q24h (MD) Rx will sign off as no further adjustments needed  Height: 5\' 10"  (177.8 cm) Weight: 223 lb (101.2 kg) IBW/kg (Calculated) : 73  Temp (24hrs), Avg:98.6 F (37 C), Min:98.3 F (36.8 C), Max:99 F (37.2 C)   Recent Labs Lab 05/17/17 1723 05/18/17 0425 05/19/17 0416  WBC 5.2 21.3* 27.3*  CREATININE 0.99 0.97 0.90    Estimated Creatinine Clearance: 108 mL/min (by C-G formula based on SCr of 0.9 mg/dL).    No Known Allergies  Antimicrobials this admission: 9/10 rocephin >>  9/10 zmax >>   Dose adjustments this admission:   Microbiology results:  BCx:   UCx:    Sputum:    MRSA PCR:   Thank you for allowing pharmacy to be a part of this patient's care.  Lorenza EvangelistGreen, Cannon Arreola R 05/20/2017 12:05 AM

## 2017-05-20 NOTE — Progress Notes (Signed)
Pt ambulating around unit several times ,tolerated well. Denies any nausea today.

## 2017-05-21 LAB — COMPREHENSIVE METABOLIC PANEL
ALK PHOS: 78 U/L (ref 38–126)
ALT: 203 U/L — AB (ref 17–63)
AST: 169 U/L — AB (ref 15–41)
Albumin: 2.8 g/dL — ABNORMAL LOW (ref 3.5–5.0)
Anion gap: 6 (ref 5–15)
BILIRUBIN TOTAL: 1 mg/dL (ref 0.3–1.2)
BUN: 9 mg/dL (ref 6–20)
CALCIUM: 7.9 mg/dL — AB (ref 8.9–10.3)
CO2: 24 mmol/L (ref 22–32)
CREATININE: 0.81 mg/dL (ref 0.61–1.24)
Chloride: 106 mmol/L (ref 101–111)
GFR calc Af Amer: >60 mL/min (ref >60)
Glucose, Bld: 124 mg/dL — ABNORMAL HIGH (ref 65–99)
POTASSIUM: 3.4 mmol/L — AB (ref 3.5–5.1)
Sodium: 136 mmol/L (ref 135–145)
TOTAL PROTEIN: 5.9 g/dL — AB (ref 6.5–8.1)

## 2017-05-21 LAB — CBC
HEMATOCRIT: 37.4 % — AB (ref 39.0–52.0)
HEMOGLOBIN: 12.8 g/dL — AB (ref 13.0–17.0)
MCH: 28.1 pg (ref 26.0–34.0)
MCHC: 34.2 g/dL (ref 30.0–36.0)
MCV: 82 fL (ref 78.0–100.0)
Platelets: 169 10*3/uL (ref 150–400)
RBC: 4.56 MIL/uL (ref 4.22–5.81)
RDW: 13.4 % (ref 11.5–15.5)
WBC: 12.6 10*3/uL — AB (ref 4.0–10.5)

## 2017-05-21 MED ORDER — POTASSIUM CHLORIDE CRYS ER 20 MEQ PO TBCR
40.0000 meq | EXTENDED_RELEASE_TABLET | Freq: Once | ORAL | Status: AC
  Administered 2017-05-21: 40 meq via ORAL
  Filled 2017-05-21: qty 2

## 2017-05-21 MED ORDER — AZITHROMYCIN 500 MG PO TABS: 500.0000 mg | ORAL_TABLET | Freq: Every day | ORAL | 0 refills | Status: AC

## 2017-05-21 MED ORDER — AMOXICILLIN-POT CLAVULANATE 875-125 MG PO TABS: 1.0000 | ORAL_TABLET | Freq: Two times a day (BID) | ORAL | Status: DC

## 2017-05-21 MED ORDER — UNABLE TO FIND: 0 refills | Status: AC

## 2017-05-21 MED ORDER — AZITHROMYCIN 250 MG PO TABS: 500.0000 mg | ORAL_TABLET | Freq: Every day | ORAL | Status: DC

## 2017-05-21 MED ORDER — CEFPODOXIME PROXETIL 200 MG PO TABS
200.0000 mg | ORAL_TABLET | Freq: Two times a day (BID) | ORAL | Status: DC
Start: 2017-05-21 — End: 2017-05-21
  Administered 2017-05-21: 200 mg via ORAL
  Filled 2017-05-21: qty 1

## 2017-05-21 MED ORDER — CEFPODOXIME PROXETIL 200 MG PO TABS: 200.0000 mg | ORAL_TABLET | Freq: Two times a day (BID) | ORAL | 0 refills | Status: AC

## 2017-05-21 NOTE — Discharge Instructions (Signed)
Acute Pancreatitis  Acute pancreatitis is a condition in which the pancreas suddenly becomes irritated and swollen (has inflammation). The pancreas is a gland that is located behind the stomach. It produces enzymes that help to digest food. The pancreas also releases the hormones glucagon and insulin, which help to regulate blood sugar. Damage to the pancreas occurs when the digestive enzymes from the pancreas are activated before they are released into the intestine.  Most acute attacks last a couple of days and can cause serious problems. Some people become dehydrated and develop low blood pressure. In severe cases, bleeding into the pancreas can lead to shock and can be life-threatening. The lungs, heart, and kidneys may fail.  What are the causes?  The most common causes of this condition are:  · Alcohol abuse.  · Gallstones.    Other causes include:  · Certain medicines.  · Exposure to certain chemicals.  · Infection.  · Damage caused by an accident (trauma).  · Abdominal surgery.    In some cases, the cause may not be known.  What are the signs or symptoms?  Symptoms of this condition include:  · Pain in the upper abdomen that may radiate to the back.  · Tenderness and swelling of the abdomen.  · Nausea and vomiting.    How is this diagnosed?  This condition may be diagnosed based on:  · A physical exam.  · Blood tests.  · Imaging tests, such as X-rays, CT scans, or an ultrasound of the abdomen.    How is this treated?  Treatment for this condition usually requires a stay in the hospital. Treatment may include:  · Pain medicine.  · Fluid replacement through an IV tube.  · Placing a tube in the stomach to remove stomach contents and to control vomiting (NG tube, or nasogastric tube).  · Not eating for 3-4 days. This gives the pancreas a rest, because enzymes are not being produced that can cause further damage.  · Antibiotic medicines, if your condition is caused by an infection.  · Surgery on the pancreas or  gallbladder.    Follow these instructions at home:  Eating and drinking  · Follow instructions from your health care provider about diet. This may involve avoiding alcohol and decreasing the amount of fat in your diet.  · Eat smaller, more frequent meals. This reduces the amount of digestive fluids that the pancreas produces.  · Drink enough fluid to keep your urine clear or pale yellow.  · Do not drink alcohol if it caused your condition.  General instructions  · Take over-the-counter and prescription medicines only as told by your health care provider.  · Do not use any tobacco products, such as cigarettes, chewing tobacco, and e-cigarettes. If you need help quitting, ask your health care provider.  · Get plenty of rest.  · If directed, check your blood sugar at home as told by your health care provider.  · Keep all follow-up visits as told by your health care provider. This is important.  Contact a health care provider if:  · You do not recover as quickly as expected.  · You develop new or worsening symptoms.  · You have persistent pain, weakness, or nausea.  · You recover and then have another episode of pain.  · You have a fever.  Get help right away if:  · You cannot eat or keep fluids down.  · Your pain becomes severe.  · Your skin or the   white part of your eyes turns yellow (jaundice).  · You vomit.  · You feel dizzy or you faint.  · Your blood sugar is high (over 300 mg/dL).  This information is not intended to replace advice given to you by your health care provider. Make sure you discuss any questions you have with your health care provider.  Document Released: 08/27/2005 Document Revised: 01/04/2016 Document Reviewed: 05/31/2015  Elsevier Interactive Patient Education © 2018 [employer]

## 2017-05-21 NOTE — Discharge Summary (Signed)
[facility]  Physician Discharge Summary   Patient ID: John PullingRussell Madewell MRN: [id number] DOB/AGE: 58/11/1960 58 y.o.  Admit date: 05/17/2017 Discharge date: 05/21/2017  PCP: Iva BoopVia, Kevin, MD  DISCHARGE DIAGNOSES:  Principal Problem:   Idiopathic acute pancreatitis without infection or necrosis Active Problems:   Acute pancreatitis   RECOMMENDATIONS FOR OUTPATIENT FOLLOW UP: 1. Patient told that he needs to have his liver function tests checked in a few days. 2. Outpatient follow-up with his PCP 3. If LFTs remain elevated, then would recommend checking viral hepatitis panel. 4. He has been instructed to hold his statin   DISCHARGE CONDITION: fair  Diet recommendation: Low-fat bland diet  Filed Weights   05/17/17 1646 05/17/17 2200  Weight: 101.2 kg (223 lb) 101.2 kg (223 lb)    INITIAL HISTORY: 56 year old Caucasian male with a past medical history of GERD, hyperlipidemia, presented with complaints of abdominal pain. He was found to have acute pancreatitis and was hospitalized for further management. Patient developed a cough overnight on 9/9. Chest x-ray suggested pneumonia. He was placed on antibiotics.   Procedures: Left lower extremity venous Doppler negative for DVT   HOSPITAL COURSE:   Acute pancreatitis. Patient denies significant alcohol intake. No gallstones identified on ultrasound. Triglyceride level is normal. He has been taking atorvastatin for a year. Reason for his pancreatitis is not entirely clear. This is likely idiopathic. Patient has improved. He is tolerating his diet. WBC has improved as well. Abdomen remains benign.    Transaminitis Increase in AST and ALT noted, but bilirubin is normal. Could be related to his acute infection. He's had 2 different imaging studies including CT scan and ultrasound, both of which did not show any acute findings in the liver. Patient symptomatically feels completely normal. Patient keen on going home today. It is  the reasonable to monitor this closely as an outpatient. Patient has been given a prescription for blood work to be done in 2-3 days. If LFTs remain elevated, then he may need referral to see gastroenterology. Will also need to have viral hepatitis panel checked at that time. Patient has been told that if he has recurrence of his symptoms or if he notices yellowish discoloration of his eyes or skin. He needs to seek attention immediately.  Pneumonia, likely aspiration. Patient developed a cough and chest x-ray suggested atelectasis versus infiltrate in the right base. He did have crackles on examination. He was given antibiotics. He feels much better this morning. Will change to oral antibiotics and discharge him with a prescription for 5 more days. He's been ambulating in the hallway without any difficulty and saturating normal on room air.  Left lower extremity DVT suspected on CT scan. Doppler studies negative for DVT. No further workup.  History of GERD. Continue PPI.  Hyperlipidemia. He's been asked to hold his statin to his LFTs returned back to normal.  Patient is stable. Okay for discharge home today.   PERTINENT LABS:  The results of significant diagnostics from this hospitalization (including imaging, microbiology, ancillary and laboratory) are listed below for reference.     Labs: Basic Metabolic Panel:  Recent Labs Lab 05/17/17 1723 05/18/17 0425 05/19/17 0416 05/20/17 0358 05/21/17 0348  NA 135 134* 131* 136 136  K 4.0 3.6 3.8 3.4* 3.4*  CL 103 104 104 104 106  CO2 22 22 20* 26 24  GLUCOSE 126* 126* 135* 153* 124*  BUN 20 19 13 8 9   CREATININE 0.99 0.97 0.90 0.89 0.81  CALCIUM 8.9 8.0* 7.8*  7.9* 7.9*   Liver Function Tests:  Recent Labs Lab 05/17/17 1723 05/18/17 0425 05/19/17 0416 05/20/17 0358 05/21/17 0348  AST 102* 169*  ALT 98* 203*  ALKPHOS 62 46 40 47 78  BILITOT 1.1 1.2 1.7* 0.9 1.0  PROT 7.3 5.9* 5.7* 5.6* 5.9*  ALBUMIN  4.2 3.3* 3.0* 2.8* 2.8*    Recent Labs Lab 05/17/17 1723 05/18/17 0425 05/19/17 0416  LIPASE 256* 201* 56*   CBC:  Recent Labs Lab 05/17/17 1723 05/18/17 0425 05/19/17 0416 05/20/17 0358 05/21/17 0348  WBC 5.2 21.3* 27.3* 15.9* 12.6*  HGB 12.3* 14.8 13.7 12.8* 12.8*  HCT 36.1* 42.8 40.3 36.7* 37.4*  MCV 90.5 84.1 84.3 81.6 82.0  PLT 165 194 167 137* 169     IMAGING STUDIES Ct Abdomen Pelvis W Contrast  Result Date: 05/17/2017 CLINICAL DATA:  Acute abdominal pain, elevated white count EXAM: CT ABDOMEN AND PELVIS WITH CONTRAST TECHNIQUE: Multidetector CT imaging of the abdomen and pelvis was performed using the standard protocol following bolus administration of intravenous contrast. CONTRAST:  ISOVUE-300 IOPAMIDOL (ISOVUE-300) INJECTION 61% COMPARISON:  None. FINDINGS: Lower chest: Minor bibasilar atelectasis. No lower lobe pneumonia or acute process. No pericardial or pleural effusion. Normal heart size. Symmetric gynecomastia. Hepatobiliary: No focal liver abnormality is seen. No gallstones, gallbladder wall thickening, or biliary dilatation. Pancreas: Mild strandy edema and inflammation about the pancreas body and tail compatible with mild acute pancreatitis. No surrounding fluid collection or abscess. No other complicating feature. Spleen: Normal in size without focal abnormality. Adrenals/Urinary Tract: Adrenal glands are unremarkable. Kidneys are normal, without renal calculi, focal lesion, or hydronephrosis. Bladder is unremarkable. Stomach/Bowel: Negative for bowel obstruction, significant dilatation, ileus, or free air. Normal appendix demonstrated. No acute colonic abnormality. No fluid collection or abscess. Vascular/Lymphatic: Negative for aneurysm or significant atherosclerosis. Mesenteric and renal vasculature all remain patent. No retroperitoneal abnormality or hemorrhage. No adenopathy. Hepatic and portal veins are patent. Splenic and mesenteric veins are patent.  Nonocclusive hypodense filling defect in the left common femoral vein and superficial femoral vein suspicious for left femoral DVT. Reproductive: No acute finding by CT. Prostate normal size. Remote vasectomy clips noted. Other: Small fat containing inguinal hernias and small fat containing umbilical hernia. No fluid collection or ascites. Musculoskeletal: No acute osseous finding IMPRESSION: Mild acute pancreatitis of the pancreas body and tail in the left upper quadrant. No other complicating feature. Nonocclusive left common femoral and superficial femoral DVT is suspected by CT. Recommend follow-up ultrasound Electronically Signed   By: Judie Petit.  Shick M.D.   On: 05/17/2017 15:34   Dg Chest Port 1 View  Result Date: 05/19/2017 CLINICAL DATA:  Cough and weakness. EXAM: PORTABLE CHEST 1 VIEW COMPARISON:  11/02/2016 FINDINGS: Elevation of the right hemidiaphragm. Interval development of linear infiltration or atelectasis in the right lung base. Can't exclude a right basilar pneumonia. Left lung is clear. Heart size and pulmonary vascularity are normal. No blunting of costophrenic angles. No pneumothorax. IMPRESSION: Infiltration or atelectasis in the right lung base. Can't exclude pneumonia. Electronically Signed   By: Burman Nieves M.D.   On: 05/19/2017 22:08   US Abdomen Limited Ruq  Result Date: 05/17/2017 CLINICAL DATA:  Patient with history of pancreatitis. EXAM: ULTRASOUND ABDOMEN LIMITED RIGHT UPPER QUADRANT COMPARISON:  CT abdomen pelvis 05/17/2017 FINDINGS: Gallbladder: No gallbladder wall thickening. Suggestion of 2 adjacent polyps measuring up to 4 mm. No pericholecystic fluid. Negative sonographic Murphy sign. Common bile duct: Diameter: 5 mm Liver:  Increased in echogenicity. No focal lesion identified. Portal vein is patent on color Doppler imaging with normal direction of blood flow towards the liver. IMPRESSION: No cholelithiasis or sonographic evidence for acute cholecystitis. Hepatic steatosis.  Electronically Signed   By: Annia Belt M.D.   On: 05/17/2017 21:47    DISCHARGE EXAMINATION: Vitals:   05/20/17 2027 05/21/17 0425 05/21/17 0840 05/21/17 0945  BP: 130/83 124/77  132/80  Pulse: 86 76  62  Resp: 18 18    Temp: 99.5 F (37.5 C) 99 F (37.2 C) 98.9 F (37.2 C)   TempSrc: Oral Oral Oral   SpO2: 97% 99% 98%   Weight:      Height:       General appearance: alert, cooperative, appears stated age and no distress Resp: clear to auscultation bilaterally Cardio: regular rate and rhythm, S1, S2 normal, no murmur, click, rub or gallop GI: soft, non-tender; bowel sounds normal; no masses,  no organomegaly Extremities: extremities normal, atraumatic, no cyanosis or edema  DISPOSITION: Home  Discharge Instructions    Call MD for:  difficulty breathing, headache or visual disturbances    Complete by:  As directed    Call MD for:  extreme fatigue    Complete by:  As directed    Call MD for:  persistant dizziness or light-headedness    Complete by:  As directed    Call MD for:  persistant nausea and vomiting    Complete by:  As directed    Call MD for:  severe uncontrolled pain    Complete by:  As directed    Call MD for:  temperature >100.4    Complete by:  As directed    Discharge instructions    Complete by:  As directed    Please be sure to have your liver function test blood work checked within the next 3-5 days. Seek attention if you develop abdominal pain, nausea/vomiting or if your eyes turn yellow. Low fat diet.  You were cared for by a hospitalist during your hospital stay. If you have any questions about your discharge medications or the care you received while you were in the hospital after you are discharged, you can call the unit and asked to speak with the hospitalist on call if the hospitalist that took care of you is not available. Once you are discharged, your primary care physician will handle any further medical issues. Please note that NO REFILLS for any  discharge medications will be authorized once you are discharged, as it is imperative that you return to your primary care physician (or establish a relationship with a primary care physician if you do not have one) for your aftercare needs so that they can reassess your need for medications and monitor your lab values. If you do not have a primary care physician, you can call [phone] for a physician referral.   Increase activity slowly    Complete by:  As directed       ALLERGIES: No Known Allergies   Current Discharge Medication List    START taking these medications   Details  azithromycin (ZITHROMAX) 500 MG tablet Take 1 tablet (500 mg total) by mouth daily. Starting 05/22/17 Qty: 4 tablet, Refills: 0    cefpodoxime (VANTIN) 200 MG tablet Take 1 tablet (200 mg total) by mouth every 12 (twelve) hours. Qty: 10 tablet, Refills: 0    UNABLE TO FIND Blood work on 9/13 or 9/14: Complete Metabolic Panel. Dx. Transaminitis. Results to PCP.  Qty: 1 each, Refills: 0      CONTINUE these medications which have NOT CHANGED   Details  aspirin EC 81 MG tablet Take 81 mg by mouth daily.    famotidine (PEPCID) 20 MG tablet One at bedtime Qty: 30 tablet, Refills: 2    fluticasone (FLONASE) 50 MCG/ACT nasal spray Place 2 sprays into both nostrils daily as needed for allergies or rhinitis.    loratadine (CLARITIN) 10 MG tablet Take 10 mg by mouth daily.    omeprazole (PRILOSEC) 20 MG capsule Take 20 mg by mouth daily.      STOP taking these medications     ATORVASTATIN CALCIUM PO      Coenzyme Q10 (CO Q 10 PO)          Follow-up Information    Via, Caryn Bee, MD. Schedule an appointment as soon as possible for a visit in 1 week(s).   Specialty:  Family Medicine Why:  blood work to check LFT's in a few days Contact information: [address] [place] [address] [phone]           TOTAL DISCHARGE TIME: 35 mins  [facility]  [facility] Pager  [phone]  05/21/2017, 11:57 AM

## 2017-05-21 NOTE — Progress Notes (Signed)
Discharge instructions reviewed with patient. Patient verbalized understanding. Patient discharged via private vehicle with wife. 

## 2017-09-19 DIAGNOSIS — K219 Gastro-esophageal reflux disease without esophagitis: Secondary | ICD-10-CM | POA: Diagnosis not present

## 2017-09-19 DIAGNOSIS — R12 Heartburn: Secondary | ICD-10-CM | POA: Diagnosis not present

## 2018-03-09 IMAGING — DX DG CHEST 1V PORT
1 series · 1 of 1 positions shown · non-contrast
Comparison: 11/02/2016

CLINICAL DATA: Cough and weakness.

EXAM:
PORTABLE CHEST 1 VIEW

[chest ap]
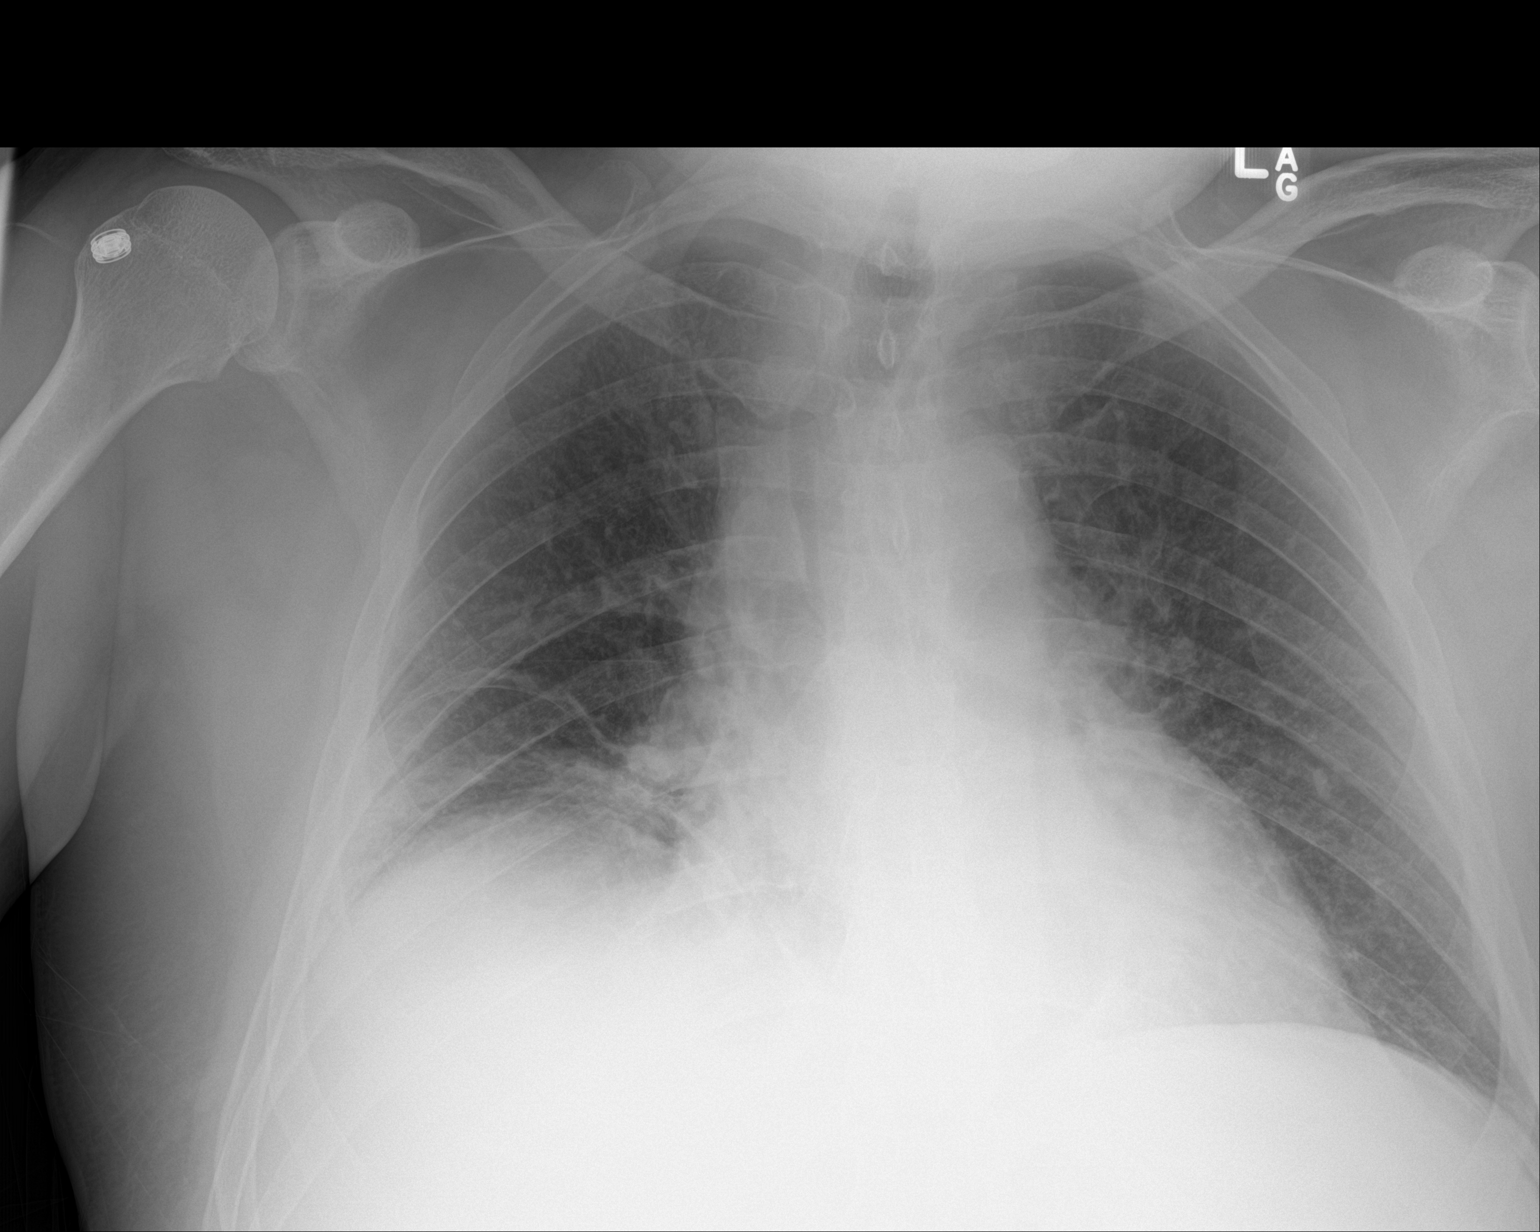

[1 of 1 positions shown; findings below may reference images not displayed]

FINDINGS: Elevation of the right hemidiaphragm. Interval development of linear
infiltration or atelectasis in the right lung base. Can't exclude a
right basilar pneumonia. Left lung is clear. Heart size and
pulmonary vascularity are normal. No blunting of costophrenic
angles. No pneumothorax.
IMPRESSION: Infiltration or atelectasis in the right lung base. Can't exclude
pneumonia.

## 2018-05-25 DIAGNOSIS — Z23 Encounter for immunization: Secondary | ICD-10-CM | POA: Diagnosis not present

## 2018-07-08 DIAGNOSIS — Z0000 Encounter for general adult medical examination without abnormal findings: Secondary | ICD-10-CM | POA: Diagnosis not present

## 2018-07-08 DIAGNOSIS — Z0189 Encounter for other specified special examinations: Secondary | ICD-10-CM | POA: Diagnosis not present

## 2018-10-21 DIAGNOSIS — J069 Acute upper respiratory infection, unspecified: Secondary | ICD-10-CM | POA: Diagnosis not present

## 2018-10-29 DIAGNOSIS — R05 Cough: Secondary | ICD-10-CM | POA: Diagnosis not present

## 2019-09-18 DIAGNOSIS — Z125 Encounter for screening for malignant neoplasm of prostate: Secondary | ICD-10-CM | POA: Diagnosis not present

## 2019-09-18 DIAGNOSIS — Z0000 Encounter for general adult medical examination without abnormal findings: Secondary | ICD-10-CM | POA: Diagnosis not present

## 2019-09-18 DIAGNOSIS — E785 Hyperlipidemia, unspecified: Secondary | ICD-10-CM | POA: Diagnosis not present

## 2019-12-23 DIAGNOSIS — Z5181 Encounter for therapeutic drug level monitoring: Secondary | ICD-10-CM | POA: Diagnosis not present

## 2019-12-23 DIAGNOSIS — E785 Hyperlipidemia, unspecified: Secondary | ICD-10-CM | POA: Diagnosis not present

## 2020-01-26 DIAGNOSIS — L237 Allergic contact dermatitis due to plants, except food: Secondary | ICD-10-CM | POA: Diagnosis not present

## 2021-01-14 DIAGNOSIS — L237 Allergic contact dermatitis due to plants, except food: Secondary | ICD-10-CM | POA: Diagnosis not present

## 2021-04-15 DIAGNOSIS — R21 Rash and other nonspecific skin eruption: Secondary | ICD-10-CM | POA: Diagnosis not present

## 2021-09-19 DIAGNOSIS — Z0000 Encounter for general adult medical examination without abnormal findings: Secondary | ICD-10-CM | POA: Diagnosis not present

## 2021-09-19 DIAGNOSIS — Z125 Encounter for screening for malignant neoplasm of prostate: Secondary | ICD-10-CM | POA: Diagnosis not present

## 2021-09-19 DIAGNOSIS — E785 Hyperlipidemia, unspecified: Secondary | ICD-10-CM | POA: Diagnosis not present

## 2021-09-22 DIAGNOSIS — Z0000 Encounter for general adult medical examination without abnormal findings: Secondary | ICD-10-CM | POA: Diagnosis not present

## 2022-09-25 DIAGNOSIS — Z79899 Other long term (current) drug therapy: Secondary | ICD-10-CM | POA: Diagnosis not present

## 2022-09-25 DIAGNOSIS — Z125 Encounter for screening for malignant neoplasm of prostate: Secondary | ICD-10-CM | POA: Diagnosis not present

## 2022-09-25 DIAGNOSIS — E785 Hyperlipidemia, unspecified: Secondary | ICD-10-CM | POA: Diagnosis not present

## 2022-09-27 DIAGNOSIS — Z0000 Encounter for general adult medical examination without abnormal findings: Secondary | ICD-10-CM | POA: Diagnosis not present

## 2023-01-04 DIAGNOSIS — L309 Dermatitis, unspecified: Secondary | ICD-10-CM | POA: Diagnosis not present

## 2023-02-06 DIAGNOSIS — L237 Allergic contact dermatitis due to plants, except food: Secondary | ICD-10-CM | POA: Diagnosis not present

## 2023-05-31 DIAGNOSIS — L259 Unspecified contact dermatitis, unspecified cause: Secondary | ICD-10-CM | POA: Diagnosis not present

## 2023-10-07 DIAGNOSIS — Z125 Encounter for screening for malignant neoplasm of prostate: Secondary | ICD-10-CM | POA: Diagnosis not present

## 2023-10-07 DIAGNOSIS — K219 Gastro-esophageal reflux disease without esophagitis: Secondary | ICD-10-CM | POA: Diagnosis not present

## 2023-10-07 DIAGNOSIS — E785 Hyperlipidemia, unspecified: Secondary | ICD-10-CM | POA: Diagnosis not present

## 2023-10-10 DIAGNOSIS — E785 Hyperlipidemia, unspecified: Secondary | ICD-10-CM | POA: Diagnosis not present

## 2023-10-10 DIAGNOSIS — R030 Elevated blood-pressure reading, without diagnosis of hypertension: Secondary | ICD-10-CM | POA: Diagnosis not present

## 2023-10-10 DIAGNOSIS — Z0000 Encounter for general adult medical examination without abnormal findings: Secondary | ICD-10-CM | POA: Diagnosis not present

## 2023-10-10 DIAGNOSIS — J45991 Cough variant asthma: Secondary | ICD-10-CM | POA: Diagnosis not present

## 2023-10-10 DIAGNOSIS — D582 Other hemoglobinopathies: Secondary | ICD-10-CM | POA: Diagnosis not present

## 2023-12-09 DIAGNOSIS — H6993 Unspecified Eustachian tube disorder, bilateral: Secondary | ICD-10-CM | POA: Diagnosis not present

## 2023-12-09 DIAGNOSIS — J309 Allergic rhinitis, unspecified: Secondary | ICD-10-CM | POA: Diagnosis not present

## 2024-01-08 DIAGNOSIS — D582 Other hemoglobinopathies: Secondary | ICD-10-CM | POA: Diagnosis not present

## 2024-01-08 DIAGNOSIS — E785 Hyperlipidemia, unspecified: Secondary | ICD-10-CM | POA: Diagnosis not present

## 2024-02-17 DIAGNOSIS — R21 Rash and other nonspecific skin eruption: Secondary | ICD-10-CM | POA: Diagnosis not present

## 2024-02-21 DIAGNOSIS — L255 Unspecified contact dermatitis due to plants, except food: Secondary | ICD-10-CM | POA: Diagnosis not present
